# Patient Record
Sex: Female | Born: 1980 | Race: Black or African American | Hispanic: No | Marital: Single | Smoking: Never smoker
Health system: Southern US, Community
[De-identification: ages and names within clinical notes are randomized; demographics above are authoritative.]

## PROBLEM LIST (undated history)

## (undated) DIAGNOSIS — N1831 Chronic kidney disease, stage 3a: Secondary | ICD-10-CM

## (undated) DIAGNOSIS — J302 Other seasonal allergic rhinitis: Secondary | ICD-10-CM

## (undated) DIAGNOSIS — F419 Anxiety disorder, unspecified: Secondary | ICD-10-CM

## (undated) DIAGNOSIS — Z9289 Personal history of other medical treatment: Secondary | ICD-10-CM

## (undated) HISTORY — DX: Chronic kidney disease, stage 3a: N18.31

## (undated) HISTORY — PX: WISDOM TOOTH EXTRACTION: SHX21

## (undated) HISTORY — PX: TUBAL LIGATION: SHX77

## (undated) HISTORY — DX: Anxiety disorder, unspecified: F41.9

---
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
## 2001-06-23 ENCOUNTER — Emergency Department (HOSPITAL_COMMUNITY): Admission: EM | Admit: 2001-06-23 | Discharge: 2001-06-23 | Payer: Self-pay | Admitting: Emergency Medicine

## 2001-08-16 ENCOUNTER — Other Ambulatory Visit: Admission: RE | Admit: 2001-08-16 | Discharge: 2001-08-16 | Payer: Self-pay | Admitting: *Deleted

## 2001-08-28 ENCOUNTER — Inpatient Hospital Stay (HOSPITAL_COMMUNITY): Admission: AD | Admit: 2001-08-28 | Discharge: 2001-08-28 | Payer: Self-pay | Admitting: Obstetrics and Gynecology

## 2001-08-28 ENCOUNTER — Encounter: Payer: Self-pay | Admitting: Obstetrics & Gynecology

## 2002-02-07 ENCOUNTER — Inpatient Hospital Stay (HOSPITAL_COMMUNITY): Admission: AD | Admit: 2002-02-07 | Discharge: 2002-02-07 | Payer: Self-pay | Admitting: *Deleted

## 2002-02-08 ENCOUNTER — Inpatient Hospital Stay (HOSPITAL_COMMUNITY): Admission: AD | Admit: 2002-02-08 | Discharge: 2002-02-08 | Payer: Self-pay | Admitting: Obstetrics and Gynecology

## 2002-02-19 ENCOUNTER — Emergency Department (HOSPITAL_COMMUNITY): Admission: EM | Admit: 2002-02-19 | Discharge: 2002-02-19 | Payer: Self-pay | Admitting: Emergency Medicine

## 2002-03-31 ENCOUNTER — Inpatient Hospital Stay (HOSPITAL_COMMUNITY): Admission: AD | Admit: 2002-03-31 | Discharge: 2002-03-31 | Payer: Self-pay | Admitting: *Deleted

## 2002-09-02 ENCOUNTER — Inpatient Hospital Stay (HOSPITAL_COMMUNITY): Admission: AD | Admit: 2002-09-02 | Discharge: 2002-09-02 | Payer: Self-pay | Admitting: Obstetrics and Gynecology

## 2002-10-08 ENCOUNTER — Inpatient Hospital Stay (HOSPITAL_COMMUNITY): Admission: AD | Admit: 2002-10-08 | Discharge: 2002-10-08 | Payer: Self-pay | Admitting: *Deleted

## 2002-10-09 ENCOUNTER — Encounter: Payer: Self-pay | Admitting: *Deleted

## 2002-11-01 ENCOUNTER — Inpatient Hospital Stay (HOSPITAL_COMMUNITY): Admission: AD | Admit: 2002-11-01 | Discharge: 2002-11-01 | Payer: Self-pay | Admitting: Obstetrics and Gynecology

## 2002-11-20 ENCOUNTER — Inpatient Hospital Stay (HOSPITAL_COMMUNITY): Admission: AD | Admit: 2002-11-20 | Discharge: 2002-11-20 | Payer: Self-pay | Admitting: Obstetrics & Gynecology

## 2002-12-19 ENCOUNTER — Other Ambulatory Visit: Admission: RE | Admit: 2002-12-19 | Discharge: 2002-12-19 | Payer: Self-pay | Admitting: Obstetrics and Gynecology

## 2003-01-19 ENCOUNTER — Ambulatory Visit (HOSPITAL_COMMUNITY): Admission: RE | Admit: 2003-01-19 | Discharge: 2003-01-19 | Payer: Self-pay | Admitting: Obstetrics and Gynecology

## 2003-01-19 ENCOUNTER — Encounter: Payer: Self-pay | Admitting: Obstetrics and Gynecology

## 2003-03-28 ENCOUNTER — Inpatient Hospital Stay (HOSPITAL_COMMUNITY): Admission: AD | Admit: 2003-03-28 | Discharge: 2003-03-28 | Payer: Self-pay | Admitting: Obstetrics and Gynecology

## 2003-05-01 ENCOUNTER — Inpatient Hospital Stay (HOSPITAL_COMMUNITY): Admission: AD | Admit: 2003-05-01 | Discharge: 2003-05-01 | Payer: Self-pay | Admitting: Obstetrics and Gynecology

## 2003-05-12 ENCOUNTER — Observation Stay (HOSPITAL_COMMUNITY): Admission: AD | Admit: 2003-05-12 | Discharge: 2003-05-12 | Payer: Self-pay | Admitting: Obstetrics and Gynecology

## 2003-06-02 DIAGNOSIS — Z9289 Personal history of other medical treatment: Secondary | ICD-10-CM

## 2003-06-02 HISTORY — DX: Personal history of other medical treatment: Z92.89

## 2003-06-05 ENCOUNTER — Inpatient Hospital Stay (HOSPITAL_COMMUNITY): Admission: AD | Admit: 2003-06-05 | Discharge: 2003-06-05 | Payer: Self-pay | Admitting: Obstetrics and Gynecology

## 2003-06-15 ENCOUNTER — Inpatient Hospital Stay (HOSPITAL_COMMUNITY): Admission: AD | Admit: 2003-06-15 | Discharge: 2003-06-19 | Payer: Self-pay | Admitting: Obstetrics and Gynecology

## 2003-10-08 ENCOUNTER — Emergency Department (HOSPITAL_COMMUNITY): Admission: EM | Admit: 2003-10-08 | Discharge: 2003-10-08 | Payer: Self-pay | Admitting: Emergency Medicine

## 2003-11-11 ENCOUNTER — Inpatient Hospital Stay (HOSPITAL_COMMUNITY): Admission: AD | Admit: 2003-11-11 | Discharge: 2003-11-11 | Payer: Self-pay | Admitting: Obstetrics and Gynecology

## 2004-03-09 ENCOUNTER — Inpatient Hospital Stay (HOSPITAL_COMMUNITY): Admission: AD | Admit: 2004-03-09 | Discharge: 2004-03-09 | Payer: Self-pay | Admitting: Obstetrics and Gynecology

## 2004-08-01 ENCOUNTER — Emergency Department (HOSPITAL_COMMUNITY): Admission: EM | Admit: 2004-08-01 | Discharge: 2004-08-01 | Payer: Self-pay | Admitting: Emergency Medicine

## 2004-11-06 ENCOUNTER — Inpatient Hospital Stay (HOSPITAL_COMMUNITY): Admission: AD | Admit: 2004-11-06 | Discharge: 2004-11-06 | Payer: Self-pay | Admitting: Obstetrics and Gynecology

## 2005-04-23 ENCOUNTER — Inpatient Hospital Stay (HOSPITAL_COMMUNITY): Admission: AD | Admit: 2005-04-23 | Discharge: 2005-04-23 | Payer: Self-pay | Admitting: Obstetrics and Gynecology

## 2005-05-07 ENCOUNTER — Inpatient Hospital Stay (HOSPITAL_COMMUNITY): Admission: AD | Admit: 2005-05-07 | Discharge: 2005-05-07 | Payer: Self-pay | Admitting: Obstetrics and Gynecology

## 2005-06-10 ENCOUNTER — Inpatient Hospital Stay (HOSPITAL_COMMUNITY): Admission: AD | Admit: 2005-06-10 | Discharge: 2005-06-10 | Payer: Self-pay | Admitting: Family Medicine

## 2005-06-29 ENCOUNTER — Ambulatory Visit: Payer: Self-pay | Admitting: Sports Medicine

## 2005-06-30 ENCOUNTER — Inpatient Hospital Stay (HOSPITAL_COMMUNITY): Admission: AD | Admit: 2005-06-30 | Discharge: 2005-06-30 | Payer: Self-pay | Admitting: *Deleted

## 2005-07-06 ENCOUNTER — Inpatient Hospital Stay (HOSPITAL_COMMUNITY): Admission: AD | Admit: 2005-07-06 | Discharge: 2005-07-06 | Payer: Self-pay | Admitting: Obstetrics & Gynecology

## 2005-07-22 ENCOUNTER — Ambulatory Visit (HOSPITAL_COMMUNITY): Admission: RE | Admit: 2005-07-22 | Discharge: 2005-07-22 | Payer: Self-pay | Admitting: Obstetrics & Gynecology

## 2005-08-15 ENCOUNTER — Inpatient Hospital Stay (HOSPITAL_COMMUNITY): Admission: AD | Admit: 2005-08-15 | Discharge: 2005-08-15 | Payer: Self-pay | Admitting: Obstetrics & Gynecology

## 2005-10-14 ENCOUNTER — Ambulatory Visit (HOSPITAL_COMMUNITY): Admission: RE | Admit: 2005-10-14 | Discharge: 2005-10-14 | Payer: Self-pay | Admitting: Obstetrics

## 2005-11-13 ENCOUNTER — Ambulatory Visit (HOSPITAL_COMMUNITY): Admission: RE | Admit: 2005-11-13 | Discharge: 2005-11-13 | Payer: Self-pay | Admitting: Obstetrics

## 2005-12-11 ENCOUNTER — Inpatient Hospital Stay (HOSPITAL_COMMUNITY): Admission: AD | Admit: 2005-12-11 | Discharge: 2005-12-14 | Payer: Self-pay | Admitting: Obstetrics

## 2005-12-11 ENCOUNTER — Encounter (INDEPENDENT_AMBULATORY_CARE_PROVIDER_SITE_OTHER): Payer: Self-pay | Admitting: Specialist

## 2006-07-08 ENCOUNTER — Emergency Department (HOSPITAL_COMMUNITY): Admission: EM | Admit: 2006-07-08 | Discharge: 2006-07-08 | Payer: Self-pay | Admitting: Emergency Medicine

## 2006-07-29 DIAGNOSIS — J45909 Unspecified asthma, uncomplicated: Secondary | ICD-10-CM | POA: Insufficient documentation

## 2006-08-10 ENCOUNTER — Inpatient Hospital Stay (HOSPITAL_COMMUNITY): Admission: AD | Admit: 2006-08-10 | Discharge: 2006-08-10 | Payer: Self-pay | Admitting: Obstetrics & Gynecology

## 2007-04-24 ENCOUNTER — Inpatient Hospital Stay (HOSPITAL_COMMUNITY): Admission: AD | Admit: 2007-04-24 | Discharge: 2007-04-24 | Payer: Self-pay | Admitting: Obstetrics & Gynecology

## 2007-04-29 ENCOUNTER — Emergency Department (HOSPITAL_COMMUNITY): Admission: EM | Admit: 2007-04-29 | Discharge: 2007-04-29 | Payer: Self-pay | Admitting: Emergency Medicine

## 2007-07-01 ENCOUNTER — Ambulatory Visit (HOSPITAL_COMMUNITY): Admission: RE | Admit: 2007-07-01 | Discharge: 2007-07-01 | Payer: Self-pay | Admitting: Obstetrics & Gynecology

## 2007-08-11 ENCOUNTER — Ambulatory Visit (HOSPITAL_COMMUNITY): Admission: RE | Admit: 2007-08-11 | Discharge: 2007-08-11 | Payer: Self-pay | Admitting: Obstetrics & Gynecology

## 2007-08-18 ENCOUNTER — Inpatient Hospital Stay (HOSPITAL_COMMUNITY): Admission: AD | Admit: 2007-08-18 | Discharge: 2007-08-18 | Payer: Self-pay | Admitting: Obstetrics & Gynecology

## 2007-11-10 ENCOUNTER — Inpatient Hospital Stay (HOSPITAL_COMMUNITY): Admission: RE | Admit: 2007-11-10 | Discharge: 2007-11-13 | Payer: Self-pay | Admitting: Obstetrics & Gynecology

## 2007-11-10 ENCOUNTER — Encounter: Payer: Self-pay | Admitting: Obstetrics & Gynecology

## 2007-12-15 ENCOUNTER — Emergency Department (HOSPITAL_COMMUNITY): Admission: EM | Admit: 2007-12-15 | Discharge: 2007-12-15 | Payer: Self-pay | Admitting: Emergency Medicine
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
## 2008-11-21 ENCOUNTER — Ambulatory Visit (HOSPITAL_COMMUNITY): Admission: RE | Admit: 2008-11-21 | Discharge: 2008-11-21 | Payer: Self-pay | Admitting: Obstetrics

## 2009-03-25 ENCOUNTER — Inpatient Hospital Stay (HOSPITAL_COMMUNITY): Admission: AD | Admit: 2009-03-25 | Discharge: 2009-03-25 | Payer: Self-pay | Admitting: Obstetrics

## 2009-09-18 ENCOUNTER — Encounter: Admission: RE | Admit: 2009-09-18 | Discharge: 2009-09-18 | Payer: Self-pay | Admitting: Family Medicine

## 2009-10-23 ENCOUNTER — Inpatient Hospital Stay (HOSPITAL_COMMUNITY): Admission: AD | Admit: 2009-10-23 | Discharge: 2009-10-23 | Payer: Self-pay | Admitting: Obstetrics

## 2010-01-15 ENCOUNTER — Inpatient Hospital Stay (HOSPITAL_COMMUNITY): Admission: AD | Admit: 2010-01-15 | Discharge: 2010-01-15 | Payer: Self-pay | Admitting: Obstetrics & Gynecology

## 2010-01-15 ENCOUNTER — Ambulatory Visit: Payer: Self-pay | Admitting: Nurse Practitioner

## 2010-03-10 ENCOUNTER — Inpatient Hospital Stay (HOSPITAL_COMMUNITY): Admission: AD | Admit: 2010-03-10 | Discharge: 2010-03-10 | Payer: Self-pay | Admitting: Obstetrics & Gynecology

## 2010-03-14 ENCOUNTER — Inpatient Hospital Stay (HOSPITAL_COMMUNITY): Admission: AD | Admit: 2010-03-14 | Discharge: 2010-03-14 | Payer: Self-pay | Admitting: Obstetrics

## 2010-03-14 ENCOUNTER — Ambulatory Visit: Payer: Self-pay | Admitting: Obstetrics and Gynecology

## 2010-05-13 ENCOUNTER — Emergency Department (HOSPITAL_COMMUNITY)
Admission: EM | Admit: 2010-05-13 | Discharge: 2010-05-13 | Payer: Self-pay | Source: Home / Self Care | Admitting: Emergency Medicine

## 2010-05-15 ENCOUNTER — Inpatient Hospital Stay (HOSPITAL_COMMUNITY)
Admission: AD | Admit: 2010-05-15 | Discharge: 2010-05-15 | Payer: Self-pay | Source: Home / Self Care | Attending: Obstetrics | Admitting: Obstetrics

## 2010-06-22 ENCOUNTER — Encounter: Payer: Self-pay | Admitting: Endocrinology

## 2010-08-11 LAB — POCT PREGNANCY, URINE: Preg Test, Ur: NEGATIVE

## 2010-08-11 LAB — URINALYSIS, ROUTINE W REFLEX MICROSCOPIC
Bilirubin Urine: NEGATIVE
Bilirubin Urine: NEGATIVE
Hgb urine dipstick: NEGATIVE
Ketones, ur: NEGATIVE mg/dL
Nitrite: NEGATIVE
Nitrite: NEGATIVE
Protein, ur: NEGATIVE mg/dL
Urobilinogen, UA: 0.2 mg/dL (ref 0.0–1.0)
pH: 5.5 (ref 5.0–8.0)

## 2010-08-11 LAB — WET PREP, GENITAL
Clue Cells Wet Prep HPF POC: NONE SEEN
Trich, Wet Prep: NONE SEEN

## 2010-08-11 LAB — POCT I-STAT, CHEM 8
Calcium, Ion: 1.13 mmol/L (ref 1.12–1.32)
Glucose, Bld: 82 mg/dL (ref 70–99)
HCT: 39 % (ref 36.0–46.0)

## 2010-08-11 LAB — GC/CHLAMYDIA PROBE AMP, GENITAL: Chlamydia, DNA Probe: NEGATIVE

## 2010-08-13 LAB — GC/CHLAMYDIA PROBE AMP, GENITAL
Chlamydia, DNA Probe: NEGATIVE
GC Probe Amp, Genital: NEGATIVE

## 2010-08-13 LAB — URINALYSIS, ROUTINE W REFLEX MICROSCOPIC
Bilirubin Urine: NEGATIVE
Hgb urine dipstick: NEGATIVE
Ketones, ur: NEGATIVE mg/dL
Ketones, ur: NEGATIVE mg/dL
Nitrite: NEGATIVE
Nitrite: NEGATIVE
Protein, ur: NEGATIVE mg/dL
Protein, ur: NEGATIVE mg/dL
Urobilinogen, UA: 0.2 mg/dL (ref 0.0–1.0)
Urobilinogen, UA: 0.2 mg/dL (ref 0.0–1.0)

## 2010-08-13 LAB — CBC
HCT: 37.1 % (ref 36.0–46.0)
MCH: 30.7 pg (ref 26.0–34.0)
MCHC: 34.2 g/dL (ref 30.0–36.0)
Platelets: 196 10*3/uL (ref 150–400)
RBC: 4.13 MIL/uL (ref 3.87–5.11)

## 2010-08-13 LAB — POCT PREGNANCY, URINE: Preg Test, Ur: NEGATIVE

## 2010-08-13 LAB — WET PREP, GENITAL: Trich, Wet Prep: NONE SEEN

## 2010-08-14 LAB — URINALYSIS, ROUTINE W REFLEX MICROSCOPIC
Glucose, UA: NEGATIVE mg/dL
Protein, ur: NEGATIVE mg/dL
Specific Gravity, Urine: >1.03 — ABNORMAL HIGH (ref 1.005–1.030)

## 2010-08-14 LAB — GC/CHLAMYDIA PROBE AMP, GENITAL
Chlamydia, DNA Probe: NEGATIVE
GC Probe Amp, Genital: NEGATIVE

## 2010-08-14 LAB — CBC
Hemoglobin: 12.1 g/dL (ref 12.0–15.0)
MCHC: 33.3 g/dL (ref 30.0–36.0)
RDW: 14.8 % (ref 11.5–15.5)
WBC: 5.7 10*3/uL (ref 4.0–10.5)

## 2010-08-14 LAB — POCT PREGNANCY, URINE: Preg Test, Ur: NEGATIVE

## 2010-08-14 LAB — WET PREP, GENITAL: Clue Cells Wet Prep HPF POC: NONE SEEN

## 2010-08-18 LAB — URINE CULTURE
Colony Count: NO GROWTH
Culture: NO GROWTH

## 2010-08-18 LAB — GC/CHLAMYDIA PROBE AMP, GENITAL
Chlamydia, DNA Probe: NEGATIVE
GC Probe Amp, Genital: NEGATIVE

## 2010-08-18 LAB — URINALYSIS, ROUTINE W REFLEX MICROSCOPIC
Bilirubin Urine: NEGATIVE
Hgb urine dipstick: NEGATIVE
Specific Gravity, Urine: 1.025 (ref 1.005–1.030)
pH: 6 (ref 5.0–8.0)

## 2010-08-18 LAB — URINE MICROSCOPIC-ADD ON

## 2010-09-04 LAB — URINALYSIS, ROUTINE W REFLEX MICROSCOPIC
Hgb urine dipstick: NEGATIVE
Protein, ur: NEGATIVE mg/dL
Urobilinogen, UA: 0.2 mg/dL (ref 0.0–1.0)

## 2010-09-04 LAB — BASIC METABOLIC PANEL
CO2: 26 mEq/L (ref 19–32)
Calcium: 9 mg/dL (ref 8.4–10.5)
GFR calc Af Amer: >60 mL/min (ref >60)
Sodium: 136 mEq/L (ref 135–145)

## 2010-09-04 LAB — CBC
Hemoglobin: 12.7 g/dL (ref 12.0–15.0)
MCHC: 33.4 g/dL (ref 30.0–36.0)
RBC: 4.27 MIL/uL (ref 3.87–5.11)

## 2010-09-04 LAB — POCT PREGNANCY, URINE: Preg Test, Ur: NEGATIVE

## 2010-09-04 LAB — WET PREP, GENITAL: Trich, Wet Prep: NONE SEEN

## 2010-10-14 NOTE — Op Note (Signed)
[name] [name], [name] [name]            ACCOUNT NO.:  [id number]   MEDICAL RECORD NO.:  [id number]          PATIENT TYPE:  INP   LOCATION:  9107                          FACILITY:  [facility]   PHYSICIAN:  Roseanna Rainbow, M.D.DATE OF BIRTH:  1980/08/02   DATE OF PROCEDURE:  11/10/2007  DATE OF DISCHARGE:                               OPERATIVE REPORT   PREOPERATIVE DIAGNOSIS:  Intrauterine pregnancy at 35 plus weeks with a  history of 2 previous cesarean deliveries, desires a sterilization  procedure.   POSTOPERATIVE DIAGNOSIS:  Intrauterine pregnancy at 38 plus weeks with a  history of 2 previous cesarean deliveries, desires a sterilization  procedure.   PROCEDURE:  Repeat cesarean delivery and bilateral tubal ligation.   SURGEON:  Roseanna Rainbow, M.D.   ANESTHESIA:  Spinal.   ESTIMATED BLOOD LOSS:  600 mL.   INTRAVENOUS FLUIDS:  As per anesthesiology.   URINE OUTPUT:  As per anesthesiology.   COMPLICATIONS:  None.   PROCEDURE IN DETAIL:  The patient was taken to the operating room with  an IV running.  A spinal anesthetic was then placed.  She was then  placed in the dorsal lithotomy position with a leftward tilt and prepped  and draped in the usual sterile fashion.  After a time-out had been  completed, a Pfannenstiel skin incision was made with a scalpel through  the previous scar and carried down to the underlying fascia.  The fascia  was nicked in the midline.  The fascial incision was then extended  bilaterally with curved Mayo scissors.  The superior aspect of the  fascial incision was tented up and the underlying rectus muscles  dissected off.  The inferior aspect of the fascial incision was  manipulated in similar fashion.  The rectus muscles were separated in  the midline.  The parietal peritoneum was tented up and entered sharply.  This incision was then extended superiorly and inferiorly with good  visualization of the bladder.  The bladder blade was then  placed.  The  vesicouterine peritoneum was tented up and entered sharply.  This  incision was then extended bilaterally and the bladder flap created  sharply.  The bladder blade was then replaced.  The lower uterine  segment was then incised in a transverse fashion with a scalpel.  This  incision was then extended bluntly.  The infant's head was then  delivered atraumatically.  The oropharynx was suctioned with bulb  suction.  The cord was clamped and cut.  The infant was handed off to  the awaiting neonatologist.  The Apgars were 9 at one and five minutes  respectively.  The placenta was then removed.  The intrauterine cavity  was evacuated of any remaining amniotic fluid, clots, and debris with  moist laparotomy sponge.  The uterine incision was then reapproximated  in a running interlocking fashion using a suture of 0 Monocryl.  Adequate hemostasis was noted.  The left fallopian tube was then grasped  with a Babcock clamp and followed up to the fimbriated end.  The mid-  isthmic portion of the tube was regrasped.  A window was  made in the  underlying mesosalpinx.  A 2-cm segment of tube was tied off and  excised.  The right fallopian tube was then grasped with a Babcock clamp  and followed out to the fimbriated end.  The mid-isthmic portion of the  tube was then regrasped.  A 2-cm segment of tube was doubly ligated and  excised.  Again, adequate hemostasis was noted.  The paracolic gutters  were then irrigated.  The parietal peritoneum was then reapproximated in  a running fashion using 2-0 Vicryl.  The fascia was closed in a running  fashion using 0 PDS.  The skin was closed in a subcuticular fashion  using 3-0 Monocryl.  At the close of the procedure, the instrument and  pack counts were said to be correct x2.  The patient was taken to the  PACU, awake and in stable condition.      Roseanna Rainbow, M.D.  Electronically Signed     LAJ/MEDQ  D:  11/10/2007  T:  11/11/2007   Job:  [id number]

## 2010-10-14 NOTE — H&P (Signed)
NAMEKIMBERLA, DRISKILL            ACCOUNT NO.:  [id number]   MEDICAL RECORD NO.:  [id number]          PATIENT TYPE:  INP   LOCATION:  NA                            [facility]:  [facility]   PHYSICIAN:  Roseanna Rainbow, M.D.DATE OF BIRTH:  September 16, 1980   DATE OF ADMISSION:  DATE OF DISCHARGE:                              HISTORY & PHYSICAL   CHIEF COMPLAINT:  The patient is a 30 year old para 2 with an estimated  date of confinement of June 20 with an intrauterine pregnancy at 30-5/7  weeks with a history of 2 previous cesarean deliveries for an elective  repeat cesarean delivery and bilateral tubal ligation.   HISTORY OF PRESENT ILLNESS:  Please see the above.   ALLERGIES:  No known drug allergies.   MEDICATIONS:  Please see the medication reconciliation form.   OB RISK FACTORS:  Please see the above.   PAST OBSTETRICAL HISTORY:  There is a history of a voluntary termination  of pregnancy.  In January 2005, she was delivered of a live born female 8  pounds 4 ounces at 41 weeks via a cesarean delivery.  In July 2007, she  was delivered of a live born female 7 pounds 1 ounce at 39 weeks via a  repeat cesarean delivery.   PRENATAL LABS:  Blood type is A+, antibody screen negative, Chlamydia  probe negative, urine culture and sensitivity strep viridans in February  2009.  Pelvic ultrasound performed on January 30, at 19 weeks 3 days, no  placenta previa, normal AFI, normal anatomy, and size was consistent  with dates.  Pap smear negative.  One-hour GTT 88, hepatitis B surface  antigen negative, hematocrit 30.8, hemoglobin 10, and HIV nonreactive.  Quad screen normal.  Platelet count 187,000, RPR nonreactive, rubella  immune, sickle cell negative, and varicella immune.   PAST GYN HISTORY:  Noncontributory.   PAST MEDICAL HISTORY:  Asthma.   PAST SURGICAL HISTORY:  Please see the above.   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  She reports stress at  work.  Denies illicit drug use.   FAMILY HISTORY:  Hypertension.   PHYSICAL EXAM:  VITAL SIGNS:  Blood pressure 120/70.  Fetal heart rate  by Doppler 140s.  LUNGS:  Clear to auscultation bilaterally.  HEART:  Regular rate and rhythm.  ABDOMEN:  Gravid.  PELVIC EXAM:  Deferred.  EXTREMITIES:  Trace to 1+ pretibial edema.   ASSESSMENT:  Intrauterine pregnancy at 38 plus weeks with a history of 2  previous cesarean deliveries, desires a sterilization procedure.   PLAN:  Admission, repeat cesarean delivery and bilateral tubal ligation.      Roseanna Rainbow, M.D.  Electronically Signed     LAJ/MEDQ  D:  11/09/2007  T:  11/10/2007  Job:  [id number]

## 2010-10-17 NOTE — Discharge Summary (Signed)
NAME:  [name] [name], [name] [name]                      ACCOUNT NO.:  [id number]   MEDICAL RECORD NO.:  [id number]                   PATIENT TYPE:  INP   LOCATION:  9174                                 [facility]ACILITY:  [facility]   PHYSICIAN:  Osborn Coho, M.D.                DATE OF BIRTH:  June 04, 1980   DATE OF ADMISSION:  05/11/2003  DATE OF DISCHARGE:                                 DISCHARGE SUMMARY   ADMITTING DIAGNOSES:  1. Intrauterine pregnancy at 35 and five-sevenths weeks.  2. Status post maternal fall.   DISCHARGE DIAGNOSES:  1. Intrauterine pregnancy at 35 and five-sevenths weeks.  2. Status post maternal fall.   HOSPITAL COURSE:  Ms. [name] [name] is a 30 year old gravida 2 para 0-0-1-0 who  was admitted at 11 and five-sevenths weeks after falling on the wet ground  and striking her abdomen.  She reported positive fetal movement as well as  mild cramping since that time.  She denied leaking and bleeding or any other  physical trauma.   Her pregnancy had been followed by the [facility] M.D. service  and had been remarkable for:  1. ASPIRIN allergy.  2. History of chlamydia in 2003.  3. Family history of polydactyly.  4. Questionable dates.  5. Group B strep negative.   She was admitted on May 11, 2003 and was placed on fetal monitor.  Her  monitor strips remained reactive and reassuring with no decelerations.  Initially she had uterine contractions every one to two minutes lasting 40  seconds which were mild to palpation.  Her abdomen remained soft between  uterine contractions.  Her cervix was closed and thick.  Pregnancy  ultrasonography showed no evidence of placental abruption.  Her amniotic  fluid was at normal level.  Cervix was 3.9 cm on ultrasound.  An estimated  fetal weight was between the 75th and 90th percentile.  She remained  hospitalized overnight.  By the morning of May 12, 2003 she was feeling  well and was requesting to go home.  She denied  cramping, pain, or bleeding.  She reported positive fetal movement.  Her cervix remained unchanged,  closed, and thick, and she was deemed to have received the full benefit of  her hospital stay.   DISCHARGE INSTRUCTIONS:  Labor precautions as well as fetal kick count were  reviewed with the patient.   DISCHARGE MEDICATIONS:  Prenatal vitamin one p.o. daily.   DISCHARGE FOLLOW-UP:  Will occur at [facility] on May 17, 2003 as scheduled or p.r.n.     Cam Hai, C.N.M.                     Osborn Coho, M.D.   KS/MEDQ  D:  05/12/2003  T:  05/12/2003  Job:  [id number]

## 2010-10-17 NOTE — Op Note (Signed)
NAME:  [name] [name], [name] [name]                      ACCOUNT NO.:  [id number]   MEDICAL RECORD NO.:  [id number]                   PATIENT TYPE:  INP   LOCATION:  9374                                 [facility]ACILITY:  [facility]   PHYSICIAN:  Osborn Coho, M.D.                DATE OF BIRTH:  03/28/1981   DATE OF PROCEDURE:  06/16/2003  DATE OF DISCHARGE:                                 OPERATIVE REPORT   PREOPERATIVE DIAGNOSES:  1. Term intrauterine pregnancy.  2. Preeclampsia.  3. Augmentation of labor.  4. Failure of second stage.   POSTOPERATIVE DIAGNOSES:  1. Term intrauterine pregnancy.  2. Preeclampsia.  3. Augmentation of labor.  4. Failure of second stage.  5. Direct occiput posterior presentation.   PROCEDURE:  Primary low transverse C-section via Pfannenstiel skin incision.   ANESTHESIA:  Epidural.   ATTENDING:  Osborn Coho, M.D.   ASSISTANT:  Concha Pyo. [place], C.N.M. until delivery of baby, then scrub  tech. assisted, Britt Bottom.   FLUIDS:  1400 mL.   ESTIMATED BLOOD LOSS:  1500 mL.   URINE OUTPUT:  175 mL.   FINDINGS:  Live female infant with Apgars of 8 at 1 minute and 9 at 5 minutes  zyvier, normal bilateral ovaries and tubes.  Cord gas:  Arterial 7.18,  venous 7.23, weight 8 pounds, 3 ounces.   COMPLICATIONS:  None.   DESCRIPTION OF PROCEDURE:  The patient was taken to the operating room after  the risks, benefits, and alternatives were discussed with the patient and  patient verbalized understanding and consent signed and witnessed.  The  patient was prepped and draped in the normal sterile fashion after an  epidural, a surgical level obtained through the epidural.  A Pfannenstiel  skin incision was made and carried down to the underlying layer of fascia  which was then excised bilaterally in the midline.  The fascia was extended  bilaterally with the Mayo scissors.  Kocher clamps were placed on the  superior aspect of the fascial incision and the rectus  muscle excised from  the fascia.  The same was done on the inferior aspect of the fascial  incision.  The muscle was separated in the midline and the peritoneum  entered bluntly.  Bladder blade placed for retraction and bladder flap  created with the Metzenbaum scissors.  The uterine incision was made and  infant's lip noted to protrude through incision.  The incision was extended  manually and once away from the face, extended bilaterally with the bandage  scissors.  The infant was in a direct occiput posterior presentation, and  head was delivered with significant effort, as it was deep in the pelvis.  The remaining part of the infant was delivered, and the cord was clamped and  cut, and the infant was handed to the awaiting pediatricians.  Cord gases  were sent.  The placenta was removed via fundal massage.  There was  uterine  atony noted, and an additional 20 units of Pitocin was added to the IV  fluids for a total of 40 units in a liter.  There was noted to be a small  extension on the left aspect of the uterine incision which was repaired with  0 Vicryl in a running locked fashion.  The uterine incision was repaired  after clearing all the anterior aspect of the uterus of all clots and  debris.  The uterine incision was repaired with 0 Vicryl in a running lock  fashion, and a second imbricating layer was performed.  The intra-abdominal  cavity was copiously irrigated, and bilateral ovaries and fallopian tubes  appeared normal.  The peritoneum was repaired with 3-0 chromic in a running  fashion after the uterine incision was noted to be hemostatic.  The fascia  was repaired with 0 Vicryl in a running fashion.  The subcutaneous tissue  was irrigated and made hemostatic with the Bovie.  The skin was closed with  staples.  Sponge, lap, and needle count was correct.  The patient tolerated  the procedure well and was returned to the recovery room in stable  condition.  While in the  operating room at the end of the procedure, the  patient's blood pressure was noted to be a little low with systolic in the  47W, and Hespan was given secondary to significant blood loss.  In addition,  3-0 chromic was used to repair a small abrasion on the vagina where the  vacuum was placed.                                               Osborn Coho, M.D.    AR/MEDQ  D:  06/16/2003  T:  06/16/2003  Job:  [id number]

## 2010-10-17 NOTE — Discharge Summary (Signed)
[name] [name], [name] [name]            ACCOUNT NO.:  [id number]   MEDICAL RECORD NO.:  [id number]          PATIENT TYPE:  INP   LOCATION:  9110                          [facility]ILITY:  [facility]   PHYSICIAN:  Charles A. Clearance Coots, M.D.DATE OF BIRTH:  11-29-1980   DATE OF ADMISSION:  12/11/2005  DATE OF DISCHARGE:  12/14/2005                                 DISCHARGE SUMMARY   ADMITTING DIAGNOSIS:  Term pregnancy, previous cesarean section/desires  repeat cesarean section.   DISCHARGE DIAGNOSIS:  1. Term pregnancy, previous cesarean section/desires repeat cesarean      section.  2. Transverse lie status post primary low transverse cesarean section on      December 11, 2005, viable female was delivered at 0817 hours with Apgars of      9 at one minute and 9 at five minutes, weight of 3215 grams, length of      51 cm.  Mother and infant discharged home in good condition.   REASON FOR ADMISSION:  This is a 30 year old black female G3, P1, estimated  date of confinement December 19, 2005 presents for repeat cesarean section,  previous cesarean section for failure to progress.   PAST MEDICAL HISTORY:  Surgery:  Cesarean section.  Illnesses:  Asthma.   MEDICATIONS:  Prenatal vitamins.   ALLERGIES:  NO KNOWN DRUG ALLERGIES   SOCIAL HISTORY:  Single.  Negative tobacco, alcohol or recreational drug  use.   FAMILY HISTORY:  Noncontributory.   REVIEW OF SYSTEMS:  Negative.   PHYSICAL EXAMINATION:  GENERAL:  Well-nourished, well-developed female in no  acute distress.  VITAL SIGNS:  Temperature 97.7, pulse 77, blood pressure 104/67.  CHEST:  Lungs were clear to auscultation bilaterally.  Heart regular rate  and rhythm.  ABDOMEN:  Gravid, nontender.  Cervix is long and closed, presenting part  could not be palpated and informal ultrasound revealed a transverse lie.   ADMITTING LABORATORY VALUES:  Hemoglobin 9.4, hematocrit 27.5, white blood  cell count 6400, platelets 229,000.   HOSPITAL COURSE:  The  patient underwent a repeat low transverse cesarean  section on December 11, 2005.  There are no intraoperative complications.  Postoperative course was complicated by anemia.  The patient was clinically  stable and had dropped only from a hemoglobin of 9.4 to a hemoglobin of 6.8.  She required no transfusion of blood products and was discharged home postop  day #3 in good condition.   DISCHARGE LABORATORY VALUES:  Hemoglobin 6.8, hematocrit 20, white blood  cell count 6600, platelets 182,000.   DISCHARGE DISPOSITION:  Medications:  Prenatal vitamins and ibuprofen were  prescribed along with Tylox for pain.  Routine written instructions were  given for discharge after cesarean section.  The patient is to call the  office for a followup appointment in 2 weeks.      Charles A. Clearance Coots, M.D.  Electronically Signed     CAH/MEDQ  D:  01/20/2006  T:  01/20/2006  Job:  [id number]

## 2010-10-17 NOTE — Op Note (Signed)
[name] [name], [name] [name]            ACCOUNT NO.:  [id number]   MEDICAL RECORD NO.:  [id number]          PATIENT TYPE:  INP   LOCATION:  9199                          [facility]   PHYSICIAN:  Charles A. Clearance Coots, M.D.DATE OF BIRTH:  [birth year]   DATE OF PROCEDURE:  12/11/2005  DATE OF DISCHARGE:                                 OPERATIVE REPORT   PREOPERATIVE DIAGNOSIS:  Term pregnancy, previous cesarean section, desires  repeat cesarean section.   POSTOPERATIVE DIAGNOSIS:  Term pregnancy, previous cesarean section, desires  repeat cesarean section, transverse lie.   PROCEDURE:  Repeat low transverse cesarean section.   SURGEON:  Coral Ceo, M.D.   ASSISTANT:  Ronnie Derby, R.N.   ANESTHESIA:  Spinal.   ESTIMATED BLOOD LOSS:  600 mL.   IV FLUIDS:  3300 mL.   URINE OUTPUT:  150 mL.   COMPLICATIONS:  None.  Foley to gravity.   FINDINGS:  Viable female at 72.  Apgars of 9 at 1 minute, 9 at five  minutes; weight of 7 pounds 1 ounce.   OPERATION:  The patient was brought to the operating room, and after  satisfactory spinal anesthesia, the abdomen was prepped and draped in the  usual sterile fashion.  A Pfannenstiel skin incision was made with a scalpel  through the previous scar down to the fascia.  Fascia was nicked in the  midline, and the fascial incision was extended to left to right with curved  Mayo scissors.  The superior and inferior fascial edges were taken off the  rectus muscles with both blunt sharp dissection.  The anterior fascia was  lacerated while sharply separating the fascia from the rectus muscle, and  this was saved for repair at conclusion of the procedure.  Rectus muscles  were then bluntly and sharply divided in the midline.  Peritoneum was  entered digitally and was digitally extended to left and to right.  The  bladder blade was positioned, and the vesicouterine fold of peritoneum above  the reflection of the urinary bladder was grasped with  forceps and was  incised and undermined with Metzenbaum scissors.  The incision was then  extended to left and to right with Metzenbaum scissors.  The bladder flap  was then bluntly developed, and bladder blade was repositioned in front of  the urinary bladder, placing it well out of operative field.  The uterus was  then entered transversely in the lower uterine segment with a scalpel.  The  uterine incision was extended to the left to the right with the bandage  scissors.  The presentation was then assessed and was noted to be transverse  lie with the head on the maternal right.  Breech was identified and was  brought up into the incision, and the delivery was accomplished as a frank  breech extraction without complications.  Umbilical cord was doubly clamped  and cut, and the infant's mouth and nose were suctioned with a suction bulb,  and the infant was handed off to the nursery staff.  Cord blood was  obtained, and the placenta was spontaneously expelled from the uterine  cavity  intact.  The endometrial surface was thoroughly debrided with a dry  lap sponge.  The edges of the uterine incision were grasped with ring  forceps.  Uterus was closed with a continuous interlocking suture of 3-0  Monocryl from each corner to the center.  Hemostasis was excellent.  The  pelvic cavity was then thoroughly irrigated with warm saline solution, and  all clots were removed.  The abdomen was then closed as follows:  Peritoneum  was closed with a continuous suture of 2-0 Monocryl.  The area of the  anterior fascia that was lacerated during the separation of the fascia from  the rectus muscle was closed with continuous suture of 0 Vicryl.  The fascia  was then closed with continuous suture of 0 PDS from each corner to the  center.  Subcutaneous tissue was thoroughly irrigated with warm saline  solution, and all areas of subcutaneous bleeding were coagulated with the  Bovie.  The skin was then closed  with surgical stainless steel staples.  Sterile bandage was applied to the incision closure.  Surgical technician  indicated that all needle, sponge and instrument counts were correct x2.  The patient tolerated the procedure well and was transported to recovery  room in satisfactory condition.      Charles A. Clearance Coots, M.D.  Electronically Signed     CAH/MEDQ  D:  12/11/2005  T:  12/11/2005  Job:  [id number]

## 2010-10-17 NOTE — H&P (Signed)
NAME:  CARLEN, REBUCK                      ACCOUNT NO.:  [id number]   MEDICAL RECORD NO.:  [id number]                   PATIENT TYPE:  MAT   LOCATION:  [facility]C                                 FACILITY:  [facility]   PHYSICIAN:  Osborn Coho, M.D.                DATE OF BIRTH:  February 06, 1981   DATE OF ADMISSION:  05/11/2003  DATE OF DISCHARGE:                                HISTORY & PHYSICAL   HISTORY OF PRESENT ILLNESS:  This is a 30 year old gravida 2 para 0-0-1-0 at  62 and five-sevenths weeks who presented status post falling on wet ground  and striking abdomen this morning.  Reports positive fetal movement but does  report mild cramping and denies bleeding.  She denies any other physical  trauma and denies physical assault.  Pregnancy has been followed by Dr.  Estanislado Pandy and remarkable for:  1. ASPIRIN allergy.  2. History of chlamydia.  3. Family history polydactyly.  4. Unsure dates.  5. Evaluation for PIH at 34 weeks which was negative.   OBSTETRICAL HISTORY:  Remarkable for an elective abortion in 2003 at eight  weeks with no complications.   MEDICAL HISTORY:  Remarkable for history of chlamydia in 2003 which was  treated, childhood varicella, and history of hepatitis B vaccination.  She  has a history of asthma diagnosed as an infant for which she uses nebulizers  as needed.   FAMILY HISTORY:  Remarkable for grandmother with heart attack.  Mother's  sister, grandfather, and grandmother with hypertension.  Grandmother with  diabetes.  Mother with diabetes.  An aunt with lymphoma.  Mother with  bipolar disorder.   GENETIC HISTORY:  Remarkable for the patient's sister who was born with an  extra digit and the father-of-the-baby's niece who had tongue attached to  the frenulum.   SOCIAL HISTORY:  The patient is single but involved with Sharlet Salina who  is involved and supportive.  She does not report a religious affiliation.  She denies any alcohol, tobacco, or drug use.   PRENATAL LABORATORY DATA:  Hemoglobin 10.7, platelets 225.  Blood type A  positive, antibody screen negative.  Sickle cell negative.  RPR nonreactive.  Rubella immune.  Hepatitis negative.  HIV nonreactive.  Gonorrhea negative,  chlamydia negative.  Cystic fibrosis negative.  Quad screen abnormal with  normal ultrasound and group B strep negative.   OBJECTIVE DATA:  VITAL SIGNS:  Stable, afebrile.  HEENT:  Within normal limits.  Thyroid not enlarged.  CHEST:  Clear to auscultation.  HEART:  Regular rate and rhythm.  ABDOMEN:  Soft between contractions and nontender.  EFM shows a reactive  fetal heart rate with no decelerations and uterine contractions every one to  two minutes which are mild.  PELVIC:  Cervix was closed, long, and -2.  BACK:  Negative CVA tenderness.  EXTREMITIES:  Within normal limits.   Ultrasound shows fetal heart rate of 145, positive fetal movement, cephalic  presentation.  Placenta is anterior, grade 1, no evidence of abruption.  AFI  is 12.3.  Growth is 75-95 percentile.  Cervix is 3.9 cm long.   ASSESSMENT:  1. Intrauterine pregnancy at 35 and five-sevenths weeks.  2. Status post maternal fall.  3. Uterine contractions.   PLAN:  1. Admit to antenatal per Dr. Su Hilt.  2. Routine M.D. orders.  3. Continuous fetal monitoring.  4. Routine antenatal orders.     Marie L. Lynam, C.N.M.                 Osborn Coho, M.D.    MLW/MEDQ  D:  05/11/2003  T:  05/11/2003  Job:  [id number]

## 2010-10-17 NOTE — Discharge Summary (Signed)
[name] [name], [name] [name]            ACCOUNT NO.:  [id number]   MEDICAL RECORD NO.:  [id number]          PATIENT TYPE:  INP   LOCATION:  9107                          [facility]:  [facility]   PHYSICIAN:  Charles A. Clearance Coots, M.D.DATE OF BIRTH:  [birth year]   DATE OF ADMISSION:  11/10/2007  DATE OF DISCHARGE:  11/13/2007                               DISCHARGE SUMMARY   ADMITTING DIAGNOSES:  [redacted] weeks gestation, 2 previous cesarean sections,  desire repeat cesarean section, desire permanent sterilization.   DISCHARGE DIAGNOSES:  [redacted] weeks gestation, 2 previous cesarean sections,  desire repeat cesarean section, desire permanent sterilization, status  post repeat low-transverse cesarean section on November 10, 2007, viable  female delivered at 1323, Apgars of 9 at 1 minute and 9 at 5 minutes,  weight of 2715 g, length of 49.53 cm.  Mother and infant discharged home  in good condition.   REASON FOR ADMISSION:  A 30 year old para 2 with estimated date of  confinement of November 19, 2007, history of 2 previous cesarean sections,  desired repeat cesarean section, desired permanent sterilization.   PAST MEDICAL HISTORY:  Surgery, cesarean section x2.  Illnesses, asthma.   MEDICATIONS:  Prenatal vitamins.   ALLERGIES:  No known drug allergies.   SOCIAL HISTORY:  Engaged.  Works as a Environmental manager.  Negative tobacco,  alcohol, or recreational drug use.   FAMILY HISTORY:  Positive for hypertension.   PHYSICAL EXAM:  GENERAL:  Well-nourished, well-developed female in no  acute distress.  She is afebrile.  VITAL SIGNS:  Blood pressure 120/70, fetal heart rate 140-150 beats per  minute with Doppler.  LUNGS:  Clear to auscultation bilaterally.  HEART:  Regular rate and rhythm.  ABDOMEN:  Gravid, nontender.  PELVIC:  Deferred.  EXTREMITIES:  Trace to 1+ pretibial edema.   ADMITTING LABS:  Hemoglobin 10.2, hematocrit 29.7, white blood cell  count 5400, platelets 183,000.  RPR was nonreactive.   HOSPITAL  COURSE:  The patient underwent a repeat low transverse cesarean  section and bilateral tubal ligation on November 10, 2007.  There were no  intraoperative complications.  Postoperative course was uncomplicated.  The patient was discharged home on postop day #3 in good condition.   DISCHARGE LABS:  Hemoglobin 9.7, hematocrit 27.7, white blood cell count  8800, platelets 181,000.   DISCHARGE DISPOSITION:  Medications, continue prenatal vitamins.  Ibuprofen and Percocet was prescribed for pain.  Routine written  instructions were given for discharge after cesarean section.  The  patient is to call our office for a followup appointment in 2 weeks.      Charles A. Clearance Coots, M.D.  Electronically Signed     CAH/MEDQ  D:  01/06/2008  T:  01/07/2008  Job:  [id number]

## 2010-10-17 NOTE — Discharge Summary (Signed)
NAME:  [name] [name], [name] [name]                      ACCOUNT NO.:  [id number]   MEDICAL RECORD NO.:  [id number]                   PATIENT TYPE:  INP   LOCATION:  9311                                 [facility]   PHYSICIAN:  Hal Morales, M.D.             DATE OF BIRTH:  Mar 21, 1981   DATE OF ADMISSION:  06/15/2003  DATE OF DISCHARGE:  06/19/2003                                 DISCHARGE SUMMARY   ADMISSION DIAGNOSES:  1. Intrauterine pregnancy at term.  2. Preeclampsia.  3. Early labor with spontaneous rupture of membranes.  4. Group B Strep negative.   DISCHARGE DIAGNOSES:  1. Intrauterine pregnancy at term.  2. Preeclampsia.  3. Early labor with spontaneous rupture of membranes.  4. Group B Strep negative.  5. Failure of second stage and direct occiput posterior presentation.   PROCEDURES:  1. Epidural anesthesia.  2. Augmentation of labor.  3. Primary low transverse cesarean section.  4. Magnesium sulfate administration.  5. Adult ICU care.   HOSPITAL COURSE:  The patient was admitted with spontaneous rupture of  membranes in early labor.  Her cervix on admission was 1 cm dilated and she  was placed on the monitor and PIH labs were drawn secondary to elevated  blood pressures of 128 to 150/90 to 100.  Catheterized UA was obtained.  Her  urine returned with 30 mg per liter of protein and diagnosis was therefore  made of preeclampsia.  Pitocin was begun by high dose protocol to augment  labor.  Epidural was placed for analgesia.  She progressed throughout the  afternoon to complete dilatation. She pushed to a vertex position of +2 to  +3.  Vacuum assisted delivery was attempted without success.  Risks and  benefits were reviewed with the patient who elected to proceed with elective  primary low transverse cesarean section.  During labor, her baby remained  stable.  Low transverse cesarean section was performed by Dr. Su Hilt under  epidural anesthesia with an EBL of 1500  mL due to an extension of her  uterine incision.  A live female infant named Lisbeth Renshaw was delivered.  Apgars 8  and 9 weighing 8 pounds 3 ounces in a direct occiput posterior position.  He  was taken to the nursery and the patient was taken to recovery.  After  recovery, the patient was taken to the AICU for monitoring and magnesium  sulfate therapy.  She did well except for a severe anemia related to her  blood loss in the operating room.  This anemia was treated on postoperative  day #1 with two units packed red blood cell infusion for relief of  symptomatic anemia.  Her hemoglobin prior to transfusion was 5.5.  Pressures  ranged 110 to 140/50 to 70 with orthostatic changes noted upon standing.  Oxygen saturation was 92 to 96% and urine output began to increase.   On postoperative day #2, she was doing well and  was without dizziness.  Blood pressures were 130 to 140/70 to 84.  Oxygen saturations within normal  limits.  Urine output was adequate.  Hemoglobin went up to 7.5.  Creatinine  was 1.1.  One dose of Lasix was given to assist in diuresis.  This produced  1000 mL of diuresis over one hour's time.  The patient was transferred to  the mother/baby unit on the third floor where she did well.   On postoperative day #3, she was doing well with mild incisional pain which  was controlled with medications.  Blood pressure was 121 to 136/72 to 88.  Urine output was sufficient.  Heart was regular rate and rhythm.  Lungs were  clear bilaterally.  Abdomen was soft and appropriately tender with positive  bowel sounds.  Incision was clean, dry and intact.  Staples were removed and  Steri-Strips placed.  Lochia was minimum and the patient was deemed to have  received full benefit of her hospital stay and was discharged home.   DISCHARGE MEDICATIONS:  1. Tylox one to two p.o. q.4h. p.r.n. pain.  2. Motrin 600 mg p.o. q.6h. p.r.n. pain.  3. Micronor one p.o. daily.   DISCHARGE LABORATORY DATA:   White blood cell count 8.6, hemoglobin 7.5,  hematocrit 21.9, platelets 184.   DISCHARGE INSTRUCTIONS:  Per [facility] handout.   FOLLOW UP:  This will include blood pressure check at home in the next one  to two days by her [facility] nurse and then in one week at the office for  blood pressure check and then in six weeks.     Marie L. Mcnaught, C.N.M.                 Hal Morales, M.D.    MLW/MEDQ  D:  06/19/2003  T:  06/20/2003  Job:  [id number]

## 2010-10-17 NOTE — H&P (Signed)
NAME:  [name], [name]                      ACCOUNT NO.:  [id number]   MEDICAL RECORD NO.:  [id number]                   PATIENT TYPE:  INP   LOCATION:  9167                                 [facility]   PHYSICIAN:  Hal Morales, M.D.             DATE OF BIRTH:  Jun 04, 1980   DATE OF ADMISSION:  06/15/2003  DATE OF DISCHARGE:                                HISTORY & PHYSICAL   HISTORY OF PRESENT ILLNESS:  Ms. Coddington is a gravida 2, para 0, 0, 1, 0 at  40-5/7ths weeks who presents with spontaneous rupture of membranes at  approximately 11:30 P.M. with clear fluid noted and cramping also noted.  She denies headache, visual symptoms and epigastric pain.  She reports  positive fetal movement.   Pregnancy has been remarkable for:  1. History of elevated blood pressure in the last one to two weeks with a     negative preeclamptic evaluation on June 05, 2003.  2. Questionable dates.  3. Asthma.  4. History of Chlamydia.  5. Family history of polydactyly.   PRENATAL LABORATORY DATA:  Blood type is A positive, Rh antibody negative.  VDRL nonreactive.  Rubella titer positive.  Hepatitis B surface antigen  negative.  HIV nonreactive.  Sickle cell test negative.  GC and Chlamydia  cultures were negative.  Pap was normal.  Glucose challenge was normal.  AFP  was noted to be abnormal, she had normal anatomy.  Amnio was declined.  Hemoglobin upon entering the practice was 10.7; it was 10 at 26 weeks.  EDC  of June 10, 2003 was established by 14-week ultrasound secondary to  questionable LMP.  Group B Strep culture was negative at 36 weeks.  GC and  Chlamydia cultures were also negative as noted.   HISTORY OF PRESENT PREGNANCY:  The patient entered care at approximately 15  weeks.  She had seen her primary prior to that time for asthma management  and was out on Pulmicort, albuterol inhaler, Claritin and a nasal spray.  She was found to have an abnormal quadruple screen with  normal ultrasound at  19 weeks.  She had another ultrasound at 26 weeks that again showed normal  findings.  She had another ultrasound a 32 weeks that showed breech  presentation, but normal growth at the 64th percentile.  She had some upper  respiratory symptoms at 34 weeks and was sent to the maternity admissions  unit for some fluids secondary to feeling poorly.  At that time her blood  pressure was 130/90 and she was evaluated in maternity admissions with  normal findings.  Her blood pressure was within normal limits at 35 weeks.  She had another ultrasound at approximately 35 weeks with normal findings;  weight in the 75th-90th percentile.  At 39 weeks she had elevated blood  pressure of 140/98 noted on her office visit.  She was sent to maternity  admissions for [facility] workup, which  was negative.  Blood pressures had  normalized with rest after the serial evaluations in the MAU.  She was seen  again at 39-5/7ths weeks, again with blood pressures 140/100, but then  136/82 after 10 minutes.  She had another evaluation this week on the 11th  with no blood pressure elevation.  Cervix at that time was 1 cm.   PAST OBSTETRICAL HISTORY:  In 2003 she had a therapeutic termination of  pregnancy at eight weeks gestation with no problems.   PAST MEDICAL HISTORY:  The patient was a previous oral contraceptive and  patch user for two years.  In October 2003 she was treated for Chlamydia and  her partner was also treated.  She reports the usual childhood illnesses.  She has occasional yeast infections.  The patient was diagnosed with asthma  as an infant, but had not had any problems prior to pregnancy.  Her care was  provided by Dr. Idolina Primer.  The patient has had one UTI and was hospitalized  at 84 months of age for asthma.   ALLERGIES:  The patient is allergic to ASPIRIN, which causes a rash, but she  can take ibuprofen.   FAMILY HISTORY:  The patient's maternal grandmother had four heart  attacks.  Her mother, sister,  maternal grandfather and maternal grandmother are  hypertensive on medication.  Maternal grandmother is an adult onset insulin-  dependent diabetic.  Heart rate mother has adult onset diabetes.  Maternal  aunt had Hodgkin's lymphoma.  Mother is bipolar and has depression.  Mother,  father and father of the baby are all smokers.   GENETIC HISTORY:  Genetic history is remarkable for the patient's younger  sister born with an extra digit.  Father of the baby's niece was tongue tied  to the frenulum.   SOCIAL HISTORY:  The patient is single.  The father of the baby is involved  and supportive; his name is Sharlet Salina.  he is present with her today.  The patient has high school and some additional education.  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  Her partner has a 10th grade education; he is currently  unemployed.  She has been followed by the Physician Service at [facility].  She denies any alcohol, drug or tobacco use during this  pregnancy.   PHYSICAL EXAMINATION:  VITAL SIGNS:  Initial blood pressures are 137/93 and  150/100.  Other vital signs are stable.  HEENT:  Within normal limits.  LUNGS:  Bilateral breath sounds are clear.  HEART:  Regular rate and rhythm without murmur.  BREASTS:  Breasts are soft and nontender.  ABDOMEN:  Fundal height is approximately 38 cm.  Estimated fetal weight is 7-  7.5 pounds.  Uterine contractions every two to three minutes of mild-to-  moderate quality.  PELVIC EXAMINATION:  The patient is noted to be leaking clear fluid.  It is  positive nitrazine.  Cervix is 1 cm, 75%, vertex and a -1 to a -2 station.  Electronic fetal monitoring reveals reactive tracing with no decelerations.  EXTREMITIES:  Deep tendon reflexes are 2+ without clonus.  There is no edema  noted.   IMPRESSION:  1. Intrauterine pregnancy at 40-5/7ths weeks.  2. Early labor with spontaneous rupture of membranes and clear fluid. 3. Elevated  blood pressure.  4. Negative group B Streptococcus.   PLAN:  1. Admit to birthing suite for consult with Dr. Pennie Rushing as the attending     physician.  2. Routine  physician orders.  3. Plan PIH labs and cathed UA.  4. M.D. will follow.     Renaldo Reel Emilee Hero, C.N.M.                   Hal Morales, M.D.    Leeanne Mannan  D:  06/15/2003  T:  06/15/2003  Job:  [id number]

## 2011-02-26 LAB — CBC
HCT: 27.7 — ABNORMAL LOW
HCT: 29.7 — ABNORMAL LOW
Hemoglobin: 10.2 — ABNORMAL LOW
MCHC: 34.4
MCHC: 34.9
MCV: 83.7
MCV: 85.1
Platelets: 181
RBC: 3.54 — ABNORMAL LOW
RDW: 14.4

## 2011-02-26 LAB — RPR: RPR Ser Ql: NONREACTIVE

## 2011-03-10 LAB — URINALYSIS, ROUTINE W REFLEX MICROSCOPIC
Bilirubin Urine: NEGATIVE
Ketones, ur: NEGATIVE
Nitrite: NEGATIVE
Protein, ur: NEGATIVE
Urobilinogen, UA: 0.2

## 2011-03-10 LAB — WET PREP, GENITAL

## 2011-03-10 LAB — GC/CHLAMYDIA PROBE AMP, GENITAL: GC Probe Amp, Genital: NEGATIVE

## 2011-08-26 ENCOUNTER — Inpatient Hospital Stay (HOSPITAL_COMMUNITY)
Admission: AD | Admit: 2011-08-26 | Discharge: 2011-08-26 | Disposition: A | Discharge status: Home / Self Care | Payer: Medicaid Other | Source: Ambulatory Visit | Attending: Obstetrics & Gynecology | Admitting: Obstetrics & Gynecology

## 2011-08-26 ENCOUNTER — Encounter (HOSPITAL_COMMUNITY): Payer: Self-pay | Admitting: *Deleted

## 2011-08-26 DIAGNOSIS — R102 Pelvic and perineal pain: Secondary | ICD-10-CM

## 2011-08-26 DIAGNOSIS — B9689 Other specified bacterial agents as the cause of diseases classified elsewhere: Secondary | ICD-10-CM | POA: Insufficient documentation

## 2011-08-26 DIAGNOSIS — N949 Unspecified condition associated with female genital organs and menstrual cycle: Secondary | ICD-10-CM | POA: Insufficient documentation

## 2011-08-26 DIAGNOSIS — R109 Unspecified abdominal pain: Secondary | ICD-10-CM | POA: Insufficient documentation

## 2011-08-26 DIAGNOSIS — A499 Bacterial infection, unspecified: Secondary | ICD-10-CM

## 2011-08-26 DIAGNOSIS — N760 Acute vaginitis: Secondary | ICD-10-CM

## 2011-08-26 LAB — URINALYSIS, ROUTINE W REFLEX MICROSCOPIC
Leukocytes, UA: NEGATIVE
Protein, ur: NEGATIVE mg/dL
Urobilinogen, UA: 0.2 mg/dL (ref 0.0–1.0)

## 2011-08-26 LAB — CBC
Platelets: 231 10*3/uL (ref 150–400)
RDW: 13.2 % (ref 11.5–15.5)
WBC: 7 10*3/uL (ref 4.0–10.5)

## 2011-08-26 LAB — WET PREP, GENITAL: Trich, Wet Prep: NONE SEEN

## 2011-08-26 MED ORDER — NAPROXEN SODIUM 550 MG PO TABS: 550.0000 mg | ORAL_TABLET | Freq: Two times a day (BID) | ORAL | Status: DC

## 2011-08-26 MED ORDER — METRONIDAZOLE 500 MG PO TABS: 500.0000 mg | ORAL_TABLET | Freq: Two times a day (BID) | ORAL | Status: AC

## 2011-08-26 NOTE — MAU Provider Note (Signed)
History   Pt presents today c/o lower abd pain that radiates to her back. She states the pain has been present for the past 2-3wks. She has also noticed a vag dc. She denies dysuria, fever, or any other sx at this time. She has had a prior BTL and has been using Depo-provera to control her menses.  CSN: [id number]  Arrival date and time: 08/26/11 2049   First Provider Initiated Contact with Patient 08/26/11 2150      Chief Complaint  Patient presents with  . Abdominal Pain   HPI  OB History    Grav Para Term Preterm Abortions TAB SAB Ect Mult Living   3 3        3       Past Medical History  Diagnosis Date  . Asthma     Past Surgical History  Procedure Date  . Cesarean section     History reviewed. No pertinent family history.  History  Substance Use Topics  . Smoking status: Never Smoker   . Smokeless tobacco: Not on file  . Alcohol Use: No    Allergies: No Known Allergies  No prescriptions prior to admission    Review of Systems  Constitutional: Negative for fever and chills.  Eyes: Negative for blurred vision and double vision.  Cardiovascular: Negative for chest pain and palpitations.  Gastrointestinal: Positive for abdominal pain. Negative for nausea, vomiting, diarrhea and constipation.  Genitourinary: Negative for dysuria, urgency, frequency and hematuria.  Neurological: Negative for dizziness and headaches.  Psychiatric/Behavioral: Negative for depression and suicidal ideas.   Physical Exam   Blood pressure 128/77, pulse 78, temperature 98.6 F (37 C), resp. rate 18, height 5' 3.5" (1.613 m), weight 131 lb 2 oz (59.478 kg).  Physical Exam  Nursing note and vitals reviewed. Constitutional: She is oriented to person, place, and time. She appears well-developed and well-nourished. No distress.  HENT:  Head: Normocephalic and atraumatic.  Eyes: EOM are normal. Pupils are equal, round, and reactive to light.  GI: Soft. She exhibits no distension and  no mass. There is no tenderness. There is no rebound and no guarding.  Genitourinary: No bleeding around the vagina. Vaginal discharge found.       Yellowish vag dc present in the vault. Her cervix is Lg/closed. Uterus NL size and shape. No adnexal masses. Pt non-tender on exam.  Neurological: She is alert and oriented to person, place, and time.  Skin: Skin is warm and dry. She is not diaphoretic.  Psychiatric: She has a normal mood and affect. Her behavior is normal. Judgment and thought content normal.    MAU Course  Procedures  Wet prep and GC/Chlamydia cultures done.  Results for orders placed during the hospital encounter of 08/26/11 (from the past 24 hour(s))  URINALYSIS, ROUTINE W REFLEX MICROSCOPIC     Status: Abnormal   Collection Time   08/26/11  9:01 PM      Component Value Range   Color, Urine YELLOW  YELLOW    APPearance CLEAR  CLEAR    Specific Gravity, Urine >1.030 (*) 1.005 - 1.030    pH 5.5  5.0 - 8.0    Glucose, UA NEGATIVE  NEGATIVE (mg/dL)   Hgb urine dipstick NEGATIVE  NEGATIVE    Bilirubin Urine NEGATIVE  NEGATIVE    Ketones, ur NEGATIVE  NEGATIVE (mg/dL)   Protein, ur NEGATIVE  NEGATIVE (mg/dL)   Urobilinogen, UA 0.2  0.0 - 1.0 (mg/dL)   Nitrite NEGATIVE  NEGATIVE  Leukocytes, UA NEGATIVE  NEGATIVE   WET PREP, GENITAL     Status: Abnormal   Collection Time   08/26/11  9:45 PM      Component Value Range   Yeast Wet Prep HPF POC NONE SEEN  NONE SEEN    Trich, Wet Prep NONE SEEN  NONE SEEN    Clue Cells Wet Prep HPF POC MODERATE (*) NONE SEEN    WBC, Wet Prep HPF POC FEW (*) NONE SEEN   CBC     Status: Normal   Collection Time   08/26/11  9:55 PM      Component Value Range   WBC 7.0  4.0 - 10.5 (K/uL)   RBC 4.34  3.87 - 5.11 (MIL/uL)   Hemoglobin 13.2  12.0 - 15.0 (g/dL)   HCT 40.9  81.1 - 91.4 (%)   MCV 89.9  78.0 - 100.0 (fL)   MCH 30.4  26.0 - 34.0 (pg)   MCHC 33.8  30.0 - 36.0 (g/dL)   RDW 78.2  95.6 - 21.3 (%)   Platelets 231  150 - 400  (K/uL)     Assessment and Plan  BV: discussed with pt at length. Will tx with Flagyl. Warned of antabuse reaction.  Pelvic pain: uncertain etiology at this time. No evidence of compromising process. She will f/u with Dr. Tamela Oddi. Will give Rx for Anaprox DS. Discussed diet, activity, risks, and precautions.  Clinton Gallant. Brennah Quraishi III, DrHSc, MPAS, PA-C  08/26/2011, 10:13 PM

## 2011-08-26 NOTE — MAU Note (Signed)
Pt reports she has had lower abd pain x 3 days, has not taken meds at home for the pain. Has had a BTL and had a shot of Depo in January so has not had a period since then.denies dysuria.

## 2011-08-26 NOTE — Discharge Instructions (Signed)
Bacterial Vaginosis Bacterial vaginosis (BV) is a vaginal infection where the normal balance of bacteria in the vagina is disrupted. The normal balance is then replaced by an overgrowth of certain bacteria. There are several different kinds of bacteria that can cause BV. BV is the most common vaginal infection in women of childbearing age. CAUSES   The cause of BV is not fully understood. BV develops when there is an increase or imbalance of harmful bacteria.   Some activities or behaviors can upset the normal balance of bacteria in the vagina and put women at increased risk including:   Having a new sex partner or multiple sex partners.   Douching.   Using an intrauterine device (IUD) for contraception.   It is not clear what role sexual activity plays in the development of BV. However, women that have never had sexual intercourse are rarely infected with BV.  Women do not get BV from toilet seats, bedding, swimming pools or from touching objects around them.  SYMPTOMS   Grey vaginal discharge.   A fish-like odor with discharge, especially after sexual intercourse.   Itching or burning of the vagina and vulva.   Burning or pain with urination.   Some women have no signs or symptoms at all.  DIAGNOSIS  Your caregiver must examine the vagina for signs of BV. Your caregiver will perform lab tests and look at the sample of vaginal fluid through a microscope. They will look for bacteria and abnormal cells (clue cells), a pH test higher than 4.5, and a positive amine test all associated with BV.  RISKS AND COMPLICATIONS   Pelvic inflammatory disease (PID).   Infections following gynecology surgery.   Developing HIV.   Developing herpes virus.  TREATMENT  Sometimes BV will clear up without treatment. However, all women with symptoms of BV should be treated to avoid complications, especially if gynecology surgery is planned. Female partners generally do not need to be treated. However,  BV may spread between female sex partners so treatment is helpful in preventing a recurrence of BV.   BV may be treated with antibiotics. The antibiotics come in either pill or vaginal cream forms. Either can be used with nonpregnant or pregnant women, but the recommended dosages differ. These antibiotics are not harmful to the baby.   BV can recur after treatment. If this happens, a second round of antibiotics will often be prescribed.   Treatment is important for pregnant women. If not treated, BV can cause a premature delivery, especially for a pregnant woman who had a premature birth in the past. All pregnant women who have symptoms of BV should be checked and treated.   For chronic reoccurrence of BV, treatment with a type of prescribed gel vaginally twice a week is helpful.  HOME CARE INSTRUCTIONS   Finish all medication as directed by your caregiver.   Do not have sex until treatment is completed.   Tell your sexual partner that you have a vaginal infection. They should see their caregiver and be treated if they have problems, such as a mild rash or itching.   Practice safe sex. Use condoms. Only have 1 sex partner.  PREVENTION  Basic prevention steps can help reduce the risk of upsetting the natural balance of bacteria in the vagina and developing BV:  Do not have sexual intercourse (be abstinent).   Do not douche.   Use all of the medicine prescribed for treatment of BV, even if the signs and symptoms go away.     Tell your sex partner if you have BV. That way, they can be treated, if needed, to prevent reoccurrence.  SEEK MEDICAL CARE IF:   Your symptoms are not improving after 3 days of treatment.   You have increased discharge, pain, or fever.  MAKE SURE YOU:   Understand these instructions.   Will watch your condition.   Will get help right away if you are not doing well or get worse.  FOR MORE INFORMATION  Division of STD Prevention (DSTDP), Centers for Disease  Control and Prevention: www.cdc.gov/std American [employer] Health Association (ASHA): www.ashastd.org  Document Released: 05/18/2005 Document Revised: 05/07/2011 Document Reviewed: 11/08/2008 ExitCare Patient Information 2012 ExitCare, LLCAbdominal Pain, Women Abdominal (stomach, pelvic, or belly) pain can be caused by many things. It is important to tell your doctor:  The location of the pain.   Does it come and go or is it present all the time?   Are there things that start the pain (eating certain foods, exercise)?   Are there other symptoms associated with the pain (fever, nausea, vomiting, diarrhea)?  All of this is helpful to know when trying to find the cause of the pain. CAUSES   Stomach: virus or bacteria infection, or ulcer.   Intestine: appendicitis (inflamed appendix), regional ileitis (Crohn's disease), ulcerative colitis (inflamed colon), irritable bowel syndrome, diverticulitis (inflamed diverticulum of the colon), or cancer of the stomach or intestine.   Gallbladder disease or stones in the gallbladder.   Kidney disease, kidney stones, or infection.   Pancreas infection or cancer.   Fibromyalgia (pain disorder).   Diseases of the female organs:   Uterus: fibroid (non-cancerous) tumors or infection.   Fallopian tubes: infection or tubal pregnancy.   Ovary: cysts or tumors.   Pelvic adhesions (scar tissue).   Endometriosis (uterus lining tissue growing in the pelvis and on the pelvic organs).   Pelvic congestion syndrome (female organs filling up with blood just before the menstrual period).   Pain with the menstrual period.   Pain with ovulation (producing an egg).   Pain with an IUD (intrauterine device, birth control) in the uterus.   Cancer of the female organs.   Functional pain (pain not caused by a disease, may improve without treatment).   Psychological pain.   Depression.  DIAGNOSIS  Your doctor will decide the seriousness of your pain by  doing an examination.  Blood tests.   X-rays.   Ultrasound.   CT scan (computed tomography, special type of X-ray).   MRI (magnetic resonance imaging).   Cultures, for infection.   Barium enema (dye inserted in the large intestine, to better view it with X-rays).   Colonoscopy (looking in intestine with a lighted tube).   Laparoscopy (minor surgery, looking in abdomen with a lighted tube).   Major abdominal exploratory surgery (looking in abdomen with a large incision).  TREATMENT  The treatment will depend on the cause of the pain.   Many cases can be observed and treated at home.   Over-the-counter medicines recommended by your caregiver.   Prescription medicine.   Antibiotics, for infection.   Birth control pills, for painful periods or for ovulation pain.   Hormone treatment, for endometriosis.   Nerve blocking injections.   Physical therapy.   Antidepressants.   Counseling with a psychologist or psychiatrist.   Minor or major surgery.  HOME CARE INSTRUCTIONS   Do not take laxatives, unless directed by your caregiver.   Take over-the-counter pain medicine only if ordered by your   caregiver. Do not take aspirin because it can cause an upset stomach or bleeding.   Try a clear liquid diet (broth or water) as ordered by your caregiver. Slowly move to a bland diet, as tolerated, if the pain is related to the stomach or intestine.   Have a thermometer and take your temperature several times a day, and record it.   Bed rest and sleep, if it helps the pain.   Avoid sexual intercourse, if it causes pain.   Avoid stressful situations.   Keep your follow-up appointments and tests, as your caregiver orders.   If the pain does not go away with medicine or surgery, you may try:   Acupuncture.   Relaxation exercises (yoga, meditation).   Group therapy.   Counseling.  SEEK MEDICAL CARE IF:   You notice certain foods cause stomach pain.   Your home care  treatment is not helping your pain.   You need stronger pain medicine.   You want your IUD removed.   You feel faint or lightheaded.   You develop nausea and vomiting.   You develop a rash.   You are having side effects or an allergy to your medicine.  SEEK IMMEDIATE MEDICAL CARE IF:   Your pain does not go away or gets worse.   You have a fever.   Your pain is felt only in portions of the abdomen. The right side could possibly be appendicitis. The left lower portion of the abdomen could be colitis or diverticulitis.   You are passing blood in your stools (bright red or black tarry stools, with or without vomiting).   You have blood in your urine.   You develop chills, with or without a fever.   You pass out.  MAKE SURE YOU:   Understand these instructions.   Will watch your condition.   Will get help right away if you are not doing well or get worse.  Document Released: 03/15/2007 Document Revised: 05/07/2011 Document Reviewed: 04/04/2009 ExitCare Patient Information 2012 [employer] 

## 2011-08-27 LAB — POCT PREGNANCY, URINE: Preg Test, Ur: NEGATIVE

## 2011-11-05 ENCOUNTER — Emergency Department (HOSPITAL_COMMUNITY)
Admission: EM | Admit: 2011-11-05 | Discharge: 2011-11-05 | Disposition: A | Discharge status: Home / Self Care | Payer: Self-pay | Attending: Emergency Medicine | Admitting: Emergency Medicine

## 2011-11-05 ENCOUNTER — Emergency Department (HOSPITAL_COMMUNITY): Payer: Self-pay

## 2011-11-05 ENCOUNTER — Encounter (HOSPITAL_COMMUNITY): Payer: Self-pay | Admitting: Emergency Medicine

## 2011-11-05 DIAGNOSIS — J45909 Unspecified asthma, uncomplicated: Secondary | ICD-10-CM | POA: Insufficient documentation

## 2011-11-05 DIAGNOSIS — R079 Chest pain, unspecified: Secondary | ICD-10-CM | POA: Insufficient documentation

## 2011-11-05 LAB — DIFFERENTIAL
Basophils Relative: 0 % (ref 0–1)
Lymphs Abs: 1.9 10*3/uL (ref 0.7–4.0)
Monocytes Absolute: 0.4 10*3/uL (ref 0.1–1.0)
Monocytes Relative: 6 % (ref 3–12)
Neutro Abs: 4.2 10*3/uL (ref 1.7–7.7)

## 2011-11-05 LAB — CBC
HCT: 41.3 % (ref 36.0–46.0)
Hemoglobin: 14.1 g/dL (ref 12.0–15.0)
MCH: 31.1 pg (ref 26.0–34.0)
MCHC: 34.1 g/dL (ref 30.0–36.0)

## 2011-11-05 LAB — BASIC METABOLIC PANEL
BUN: 18 mg/dL (ref 6–23)
CO2: 23 mEq/L (ref 19–32)
Chloride: 106 mEq/L (ref 96–112)
Creatinine, Ser: 1.33 mg/dL — ABNORMAL HIGH (ref 0.50–1.10)
GFR calc Af Amer: 62 mL/min — ABNORMAL LOW (ref >90)
Glucose, Bld: 85 mg/dL (ref 70–99)

## 2011-11-05 NOTE — ED Notes (Signed)
Pt denies any questions upon discharge. 

## 2011-11-05 NOTE — ED Notes (Signed)
PT. REPORTS INTERMITTENT MID / LEFT CHEST PAIN WITH SOB AND DRY COUGH / VOMITTING ONSET 1 WEEK AGO .

## 2011-11-05 NOTE — Discharge Instructions (Signed)
Chest Pain (Nonspecific) It is often hard to give a specific diagnosis for the cause of chest pain. There is always a chance that your pain could be related to something serious, such as a heart attack or a blood clot in the lungs. You need to follow up with your caregiver for further evaluation. CAUSES   Heartburn.   Pneumonia or bronchitis.   Anxiety or stress.   Inflammation around your heart (pericarditis) or lung (pleuritis or pleurisy).   A blood clot in the lung.   A collapsed lung (pneumothorax). It can develop suddenly on its own (spontaneous pneumothorax) or from injury (trauma) to the chest.   Shingles infection (herpes zoster virus).  The chest wall is composed of bones, muscles, and cartilage. Any of these can be the source of the pain.  The bones can be bruised by injury.   The muscles or cartilage can be strained by coughing or overwork.   The cartilage can be affected by inflammation and become sore (costochondritis).  DIAGNOSIS  Lab tests or other studies, such as X-rays, electrocardiography, stress testing, or cardiac imaging, may be needed to find the cause of your pain.  TREATMENT   Treatment depends on what may be causing your chest pain. Treatment may include:   Acid blockers for heartburn.   Anti-inflammatory medicine.   Pain medicine for inflammatory conditions.   Antibiotics if an infection is present.   You may be advised to change lifestyle habits. This includes stopping smoking and avoiding alcohol, caffeine, and chocolate.   You may be advised to keep your head raised (elevated) when sleeping. This reduces the chance of acid going backward from your stomach into your esophagus.   Most of the time, nonspecific chest pain will improve within 2 to 3 days with rest and mild pain medicine.  HOME CARE INSTRUCTIONS   If antibiotics were prescribed, take your antibiotics as directed. Finish them even if you start to feel better.   For the next few  days, avoid physical activities that bring on chest pain. Continue physical activities as directed.   Do not smoke.   Avoid drinking alcohol.   Only take over-the-counter or prescription medicine for pain, discomfort, or fever as directed by your caregiver.   Follow your caregiver's suggestions for further testing if your chest pain does not go away.   Keep any follow-up appointments you made. If you do not go to an appointment, you could develop lasting (chronic) problems with pain. If there is any problem keeping an appointment, you must call to reschedule.  SEEK MEDICAL CARE IF:   You think you are having problems from the medicine you are taking. Read your medicine instructions carefully.   Your chest pain does not go away, even after treatment.   You develop a rash with blisters on your chest.  SEEK IMMEDIATE MEDICAL CARE IF:   You have increased chest pain or pain that spreads to your arm, neck, jaw, back, or abdomen.   You develop shortness of breath, an increasing cough, or you are coughing up blood.   You have severe back or abdominal pain, feel nauseous, or vomit.   You develop severe weakness, fainting, or chills.   You have a fever.  THIS IS AN EMERGENCY. Do not wait to see if the pain will go away. Get medical help at once. Call your local emergency services (911 in [place] Do not drive yourself to the hospital. MAKE SURE YOU:   Understand these instructions.     Will watch your condition.   Will get help right away if you are not doing well or get worse.  Document Released: 02/25/2005 Document Revised: 05/07/2011 Document Reviewed: 12/22/2007 ExitCare Patient Information 2012 [employer]. 

## 2011-11-05 NOTE — ED Provider Notes (Signed)
History     CSN: [id number]  Arrival date & time 11/05/11  4098   First MD Initiated Contact with Patient 11/05/11 2143      Chief Complaint  Patient presents with  . Chest Pain    (Consider location/radiation/quality/duration/timing/severity/associated sxs/prior treatment) HPI Comments: Patient reports intermittent bilateral upper chest pain over the last week.  There's no specific inciting or relieving factors.  Patient notes her shortness of breath with it.  She does not feel that it is similar to her past asthma exacerbations.  She's had no fevers, cough or URI type symptoms.  Patient notes no changes with eating.  She denies any vomiting or diarrhea to me.  Is not associated with stress or palpitations.  Is no radiation of her symptoms.  Patient is a 31 y.o. female presenting with chest pain. The history is provided by the patient. No language interpreter was used.  Chest Pain The chest pain began 5 - 7 days ago. Primary symptoms include shortness of breath. Pertinent negatives for primary symptoms include no fever, no cough, no abdominal pain, no nausea and no vomiting.     Past Medical History  Diagnosis Date  . Asthma     Past Surgical History  Procedure Date  . Cesarean section     History reviewed. No pertinent family history.  History  Substance Use Topics  . Smoking status: Never Smoker   . Smokeless tobacco: Not on file  . Alcohol Use: No    OB History    Grav Para Term Preterm Abortions TAB SAB Ect Mult Living   3 3        3       Review of Systems  Constitutional: Negative.  Negative for fever and chills.  HENT: Negative.   Eyes: Negative.  Negative for discharge.  Respiratory: Positive for shortness of breath. Negative for cough.   Cardiovascular: Positive for chest pain.  Gastrointestinal: Negative.  Negative for nausea, vomiting, abdominal pain and diarrhea.  Genitourinary: Negative.   Musculoskeletal: Negative.  Negative for back pain.  Skin:  Negative.  Negative for color change and rash.  Neurological: Negative.  Negative for syncope and headaches.  Hematological: Negative.  Negative for adenopathy.  Psychiatric/Behavioral: Negative.  Negative for confusion.  All other systems reviewed and are negative.    Allergies  Review of patient's allergies indicates no known allergies.  Home Medications  No current outpatient prescriptions on file.  BP 119/84  Pulse 77  Temp(Src) 98 F (36.7 C) (Oral)  Resp 18  SpO2 98%  LMP 10/30/2011  Physical Exam  Nursing note and vitals reviewed. Constitutional: She is oriented to person, place, and time. She appears well-developed and well-nourished.  Non-toxic appearance. She does not have a sickly appearance.  HENT:  Head: Normocephalic and atraumatic.  Eyes: Conjunctivae, EOM and lids are normal. Pupils are equal, round, and reactive to light. No scleral icterus.  Neck: Trachea normal and normal range of motion. Neck supple.  Cardiovascular: Normal rate, regular rhythm and normal heart sounds.  Exam reveals no gallop.   No murmur heard. Pulmonary/Chest: Effort normal and breath sounds normal. No respiratory distress. She has no wheezes. She has no rales. She exhibits no tenderness.  Abdominal: Soft. Normal appearance. There is no tenderness. There is no rebound, no guarding and no CVA tenderness.  Musculoskeletal: Normal range of motion.  Neurological: She is alert and oriented to person, place, and time. She has normal strength.  Skin: Skin is warm, dry and intact.  No rash noted.  Psychiatric: She has a normal mood and affect. Her behavior is normal. Judgment and thought content normal.    ED Course  Procedures (including critical care time)   Labs Reviewed  CBC  DIFFERENTIAL  POCT I-STAT TROPONIN I  BASIC METABOLIC PANEL   Dg Chest 2 View  11/05/2011  *RADIOLOGY REPORT*  Clinical Data: Chest pain, cough, asthma.  CHEST - 2 VIEW  Comparison: 04/29/2007  Findings: Heart  and mediastinal contours are within normal limits. No focal opacities or effusions.  No acute bony abnormality.  IMPRESSION: No active cardiopulmonary disease.  Original Report Authenticated By: Cyndie Chime, M.D.      Date: 11/05/2011  Rate: 77  Rhythm: normal sinus rhythm  QRS Axis: normal  Intervals: normal  ST/T Wave abnormalities: normal  Conduction Disutrbances:none  Narrative Interpretation:   Old EKG Reviewed: unchanged from 04-15-00    MDM  Patient with upper chest pain and shortness of breath that is of unclear etiology.  She has a normal EKG.  She has no specific infectious symptoms to suggest bronchitis, URI or pneumonia.  She has no symptoms with eating to suggest that there is any difficulty with swallowing or acid reflux.  Patient is clear lungs on exam and so does not appear to have an asthma exacerbation.  She has normal vital signs at this time and is not showing signs of hypoxia or tachycardia to suggest pulmonary embolism at this time.  By [facility] criteria this patient is negative.  While I have an unclear etiology for the patient's intermittent symptoms I do not feel there is something serious or life-threatening at this time and she is safe for discharge home.        Nat Christen, MD 11/05/11 2156

## 2012-07-28 ENCOUNTER — Inpatient Hospital Stay (HOSPITAL_COMMUNITY)
Admission: AD | Admit: 2012-07-28 | Discharge: 2012-07-28 | Disposition: A | Discharge status: Home / Self Care | Payer: Self-pay | Source: Ambulatory Visit | Attending: Obstetrics | Admitting: Obstetrics

## 2012-07-28 ENCOUNTER — Encounter (HOSPITAL_COMMUNITY): Payer: Self-pay | Admitting: *Deleted

## 2012-07-28 DIAGNOSIS — K5289 Other specified noninfective gastroenteritis and colitis: Secondary | ICD-10-CM | POA: Insufficient documentation

## 2012-07-28 DIAGNOSIS — Z3202 Encounter for pregnancy test, result negative: Secondary | ICD-10-CM | POA: Insufficient documentation

## 2012-07-28 DIAGNOSIS — R109 Unspecified abdominal pain: Secondary | ICD-10-CM | POA: Insufficient documentation

## 2012-07-28 DIAGNOSIS — R197 Diarrhea, unspecified: Secondary | ICD-10-CM

## 2012-07-28 DIAGNOSIS — R112 Nausea with vomiting, unspecified: Secondary | ICD-10-CM

## 2012-07-28 LAB — WET PREP, GENITAL
Clue Cells Wet Prep HPF POC: NONE SEEN
Trich, Wet Prep: NONE SEEN
Yeast Wet Prep HPF POC: NONE SEEN

## 2012-07-28 LAB — CBC WITH DIFFERENTIAL/PLATELET
Basophils Relative: 0 % (ref 0–1)
Eosinophils Absolute: 0.2 10*3/uL (ref 0.0–0.7)
Eosinophils Relative: 5 % (ref 0–5)
MCH: 30.1 pg (ref 26.0–34.0)
MCHC: 33.5 g/dL (ref 30.0–36.0)
MCV: 89.8 fL (ref 78.0–100.0)
Neutrophils Relative %: 55 % (ref 43–77)
Platelets: 184 10*3/uL (ref 150–400)
RDW: 13.3 % (ref 11.5–15.5)

## 2012-07-28 LAB — URINALYSIS, ROUTINE W REFLEX MICROSCOPIC
Ketones, ur: NEGATIVE mg/dL
Leukocytes, UA: NEGATIVE
Protein, ur: NEGATIVE mg/dL
Urobilinogen, UA: 0.2 mg/dL (ref 0.0–1.0)

## 2012-07-28 MED ORDER — IBUPROFEN 400 MG PO TABS
400.0000 mg | ORAL_TABLET | Freq: Once | ORAL | Status: AC
  Administered 2012-07-28: 400 mg via ORAL
  Filled 2012-07-28: qty 1

## 2012-07-28 NOTE — MAU Provider Note (Signed)
History     CSN: [id number]  Arrival date and time: 07/28/12 1304   First Provider Initiated Contact with Patient 07/28/12 1408      Chief Complaint  Patient presents with  . Abdominal Pain   HPI [name] [name] is 32 y.o. [phone] Unknown weeks presenting with lower abdominal cramping X 3 days and then this am she had nausea, vomiting X1 and diarrheaX2.  Denies exposure to illness.  Has not taken anything for the sxs today.  Took ibuprofen yesterday that took the edge off.  On Depo with last injection in January.  Had discharge with a little blood in it yesterday none today.  Last sexual activity 3 weeks ago. Patient of Dr. Marcia Brash.  She did not call the office because she did not have the required co-pay.    Past Medical History  Diagnosis Date  . Asthma     Past Surgical History  Procedure Laterality Date  . Cesarean section    . Tubal ligation      Family History  Problem Relation Age of Onset  . Diabetes Mother   . Hypertension Mother   . Hypertension Sister   . Stroke Maternal Grandmother   . Hypertension Maternal Grandmother   . Diabetes Maternal Grandmother   . Diabetes Maternal Grandfather     History  Substance Use Topics  . Smoking status: Never Smoker   . Smokeless tobacco: Never Used  . Alcohol Use: Yes     Comment: occaisional- not daily    Allergies: No Known Allergies  No prescriptions prior to admission    Review of Systems  Constitutional: Negative for fever and chills.  HENT: Negative.   Respiratory: Negative.   Cardiovascular: Negative.   Gastrointestinal: Positive for nausea, vomiting, abdominal pain (cramping) and diarrhea. Negative for constipation.  Genitourinary: Negative.   Musculoskeletal: Negative.    Physical Exam   Blood pressure 123/87, pulse 79, temperature 98.1 F (36.7 C), temperature source Oral, resp. rate 16, height 5\' 5"  (1.651 m), weight 138 lb (62.596 kg), SpO2 100.00%.  Physical Exam   Constitutional: She is oriented to person, place, and time. She appears well-developed and well-nourished. No distress.  HENT:  Head: Normocephalic.  Neck: Normal range of motion.  Cardiovascular: Normal rate.   Respiratory: Effort normal.  GI: Soft. She exhibits no distension and no mass. There is tenderness (mild tenderness in the mid lower abdomen). There is no rebound and no guarding.  Genitourinary: There is no rash, tenderness or lesion on the right labia. There is no rash, tenderness or lesion on the left labia. Uterus is not enlarged and not tender. Cervix exhibits no discharge and no friability. Right adnexum displays no mass, no tenderness and no fullness. Left adnexum displays no mass, no tenderness and no fullness. No erythema, tenderness or bleeding around the vagina. No vaginal discharge found.  Neurological: She is alert and oriented to person, place, and time.  Skin: Skin is warm and dry.  Psychiatric: She has a normal mood and affect. Her behavior is normal.   Results for orders placed during the hospital encounter of 07/28/12 (from the past 24 hour(s))  URINALYSIS, ROUTINE W REFLEX MICROSCOPIC     Status: None   Collection Time    07/28/12  1:15 PM      Result Value Range   Color, Urine YELLOW  YELLOW   APPearance CLEAR  CLEAR   Specific Gravity, Urine 1.025  1.005 - 1.030   pH 6.0  5.0 -  8.0   Glucose, UA NEGATIVE  NEGATIVE mg/dL   Hgb urine dipstick NEGATIVE  NEGATIVE   Bilirubin Urine NEGATIVE  NEGATIVE   Ketones, ur NEGATIVE  NEGATIVE mg/dL   Protein, ur NEGATIVE  NEGATIVE mg/dL   Urobilinogen, UA 0.2  0.0 - 1.0 mg/dL   Nitrite NEGATIVE  NEGATIVE   Leukocytes, UA NEGATIVE  NEGATIVE  POCT PREGNANCY, URINE     Status: None   Collection Time    07/28/12  1:38 PM      Result Value Range   Preg Test, Ur NEGATIVE  NEGATIVE  CBC WITH DIFFERENTIAL     Status: None   Collection Time    07/28/12  2:15 PM      Result Value Range   WBC 5.3  4.0 - 10.5 K/uL   RBC  4.42  3.87 - 5.11 MIL/uL   Hemoglobin 13.3  12.0 - 15.0 g/dL   HCT 16.1  09.6 - 04.5 %   MCV 89.8  78.0 - 100.0 fL   MCH 30.1  26.0 - 34.0 pg   MCHC 33.5  30.0 - 36.0 g/dL   RDW 40.9  81.1 - 91.4 %   Platelets 184  150 - 400 K/uL   Neutrophils Relative 55  43 - 77 %   Neutro Abs 2.9  1.7 - 7.7 K/uL   Lymphocytes Relative 33  12 - 46 %   Lymphs Abs 1.8  0.7 - 4.0 K/uL   Monocytes Relative 7  3 - 12 %   Monocytes Absolute 0.4  0.1 - 1.0 K/uL   Eosinophils Relative 5  0 - 5 %   Eosinophils Absolute 0.2  0.0 - 0.7 K/uL   Basophils Relative 0  0 - 1 %   Basophils Absolute 0.0  0.0 - 0.1 K/uL  WET PREP, GENITAL     Status: Abnormal   Collection Time    07/28/12  2:33 PM      Result Value Range   Yeast Wet Prep HPF POC NONE SEEN  NONE SEEN   Trich, Wet Prep NONE SEEN  NONE SEEN   Clue Cells Wet Prep HPF POC NONE SEEN  NONE SEEN   WBC, Wet Prep HPF POC FEW (*) NONE SEEN   MAU Course  Procedures GC/CHL to labs  MDM  Ibuprofen 400mg  po ordered for cramping   Assessment and Plan  A:  Nausea, vomiting and diarrhea - gastroenteritis      Abdominal cramping     Negative pregnancy test  P:  Brat diet     Important to stay well hydrated     May take Ibuprofen prn for cramping  KEY,EVE M 07/28/2012, 4:16 PM

## 2012-07-28 NOTE — MAU Note (Signed)
Patient states she has been having abdominal pain for 4 days. Had Depo injection in January. Slight discharge, no bleeding. Had one episode of nausea, vomiting and soft stool this am. No nausea at this time.

## 2012-07-28 NOTE — MAU Note (Signed)
abd cramping past four days, feels like cramping with cycle. No bleeding, small amt of discharge yesterday- blood streaked.  Last Depo was in January.

## 2012-07-29 LAB — GC/CHLAMYDIA PROBE AMP
CT Probe RNA: NEGATIVE
GC Probe RNA: NEGATIVE

## 2012-08-02 NOTE — MAU Provider Note (Signed)
Pt called for GC/Chlamdyia results- reported to pt by phone- negative/negative Pamelia Hoit, RNC/WHNP

## 2012-10-31 ENCOUNTER — Emergency Department (HOSPITAL_COMMUNITY)
Admission: EM | Admit: 2012-10-31 | Discharge: 2012-10-31 | Discharge status: Against Medical Advice | Payer: No Typology Code available for payment source | Attending: Emergency Medicine | Admitting: Emergency Medicine

## 2012-10-31 ENCOUNTER — Encounter (HOSPITAL_COMMUNITY): Payer: Self-pay | Admitting: Emergency Medicine

## 2012-10-31 DIAGNOSIS — Y9389 Activity, other specified: Secondary | ICD-10-CM | POA: Insufficient documentation

## 2012-10-31 DIAGNOSIS — S0510XA Contusion of eyeball and orbital tissues, unspecified eye, initial encounter: Secondary | ICD-10-CM | POA: Insufficient documentation

## 2012-10-31 DIAGNOSIS — Y92410 Unspecified street and highway as the place of occurrence of the external cause: Secondary | ICD-10-CM | POA: Insufficient documentation

## 2012-10-31 DIAGNOSIS — J45909 Unspecified asthma, uncomplicated: Secondary | ICD-10-CM | POA: Insufficient documentation

## 2012-10-31 DIAGNOSIS — S3981XA Other specified injuries of abdomen, initial encounter: Secondary | ICD-10-CM | POA: Insufficient documentation

## 2012-10-31 DIAGNOSIS — R110 Nausea: Secondary | ICD-10-CM | POA: Insufficient documentation

## 2012-10-31 DIAGNOSIS — Z3202 Encounter for pregnancy test, result negative: Secondary | ICD-10-CM | POA: Insufficient documentation

## 2012-10-31 LAB — URINALYSIS, ROUTINE W REFLEX MICROSCOPIC
Bilirubin Urine: NEGATIVE
Hgb urine dipstick: NEGATIVE
Ketones, ur: NEGATIVE mg/dL
Nitrite: NEGATIVE
Specific Gravity, Urine: 1.028 (ref 1.005–1.030)
Urobilinogen, UA: 1 mg/dL (ref 0.0–1.0)
pH: 5.5 (ref 5.0–8.0)
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
## 2012-10-31 NOTE — ED Notes (Signed)
Pt called multiple times for lab draw with no answer

## 2012-10-31 NOTE — ED Notes (Signed)
Pt called to be taken back to room. No response.

## 2013-01-07 ENCOUNTER — Emergency Department (HOSPITAL_COMMUNITY)
Admission: EM | Admit: 2013-01-07 | Discharge: 2013-01-07 | Disposition: A | Discharge status: Home / Self Care | Payer: Medicaid Other | Attending: Emergency Medicine | Admitting: Emergency Medicine

## 2013-01-07 ENCOUNTER — Encounter (HOSPITAL_COMMUNITY): Payer: Self-pay | Admitting: Emergency Medicine

## 2013-01-07 DIAGNOSIS — Y9389 Activity, other specified: Secondary | ICD-10-CM | POA: Insufficient documentation

## 2013-01-07 DIAGNOSIS — Z23 Encounter for immunization: Secondary | ICD-10-CM | POA: Insufficient documentation

## 2013-01-07 DIAGNOSIS — Z792 Long term (current) use of antibiotics: Secondary | ICD-10-CM | POA: Insufficient documentation

## 2013-01-07 DIAGNOSIS — J45909 Unspecified asthma, uncomplicated: Secondary | ICD-10-CM | POA: Insufficient documentation

## 2013-01-07 DIAGNOSIS — S96909A Unspecified injury of unspecified muscle and tendon at ankle and foot level, unspecified foot, initial encounter: Secondary | ICD-10-CM | POA: Insufficient documentation

## 2013-01-07 DIAGNOSIS — S91309A Unspecified open wound, unspecified foot, initial encounter: Secondary | ICD-10-CM | POA: Insufficient documentation

## 2013-01-07 DIAGNOSIS — Z791 Long term (current) use of non-steroidal anti-inflammatories (NSAID): Secondary | ICD-10-CM | POA: Insufficient documentation

## 2013-01-07 DIAGNOSIS — W268XXA Contact with other sharp object(s), not elsewhere classified, initial encounter: Secondary | ICD-10-CM | POA: Insufficient documentation

## 2013-01-07 DIAGNOSIS — S91312A Laceration without foreign body, left foot, initial encounter: Secondary | ICD-10-CM

## 2013-01-07 DIAGNOSIS — Y929 Unspecified place or not applicable: Secondary | ICD-10-CM | POA: Insufficient documentation

## 2013-01-07 MED ORDER — IBUPROFEN 800 MG PO TABS: 800.0000 mg | ORAL_TABLET | Freq: Three times a day (TID) | ORAL | Status: DC

## 2013-01-07 MED ORDER — IBUPROFEN 800 MG PO TABS
800.0000 mg | ORAL_TABLET | Freq: Once | ORAL | Status: AC
  Administered 2013-01-07: 800 mg via ORAL
  Filled 2013-01-07: qty 1

## 2013-01-07 MED ORDER — TETANUS-DIPHTH-ACELL PERTUSSIS 5-2.5-18.5 LF-MCG/0.5 IM SUSP
0.5000 mL | Freq: Once | INTRAMUSCULAR | Status: AC
  Administered 2013-01-07: 0.5 mL via INTRAMUSCULAR
  Filled 2013-01-07: qty 0.5

## 2013-01-07 MED ORDER — CIPROFLOXACIN HCL 500 MG PO TABS: 500.0000 mg | ORAL_TABLET | Freq: Two times a day (BID) | ORAL | Status: DC

## 2013-01-07 MED ORDER — HYDROCODONE-ACETAMINOPHEN 5-325 MG PO TABS: ORAL_TABLET | ORAL | Status: DC

## 2013-01-07 NOTE — ED Provider Notes (Signed)
CSN: [id number]     Arrival date & time 01/07/13  0348 History     First MD Initiated Contact with Patient 01/07/13 0425     Chief Complaint  Patient presents with  . Laceration   (Consider location/radiation/quality/duration/timing/severity/associated sxs/prior Treatment) HPI Pt is a 32yo female c/o left foot and toe pain after cutting the bottom of her foot on broken glass bottom earlier this morning while taking out the trash at a party.  Pt states there was moderate bleeding but was able to control PTA.  Pain is constant, sharp, aching pain, 3/10 at this time, worse with ambulation.  Tetanus was given 45yrs ago.  Pt otherwise healthy. No other injuries.   Past Medical History  Diagnosis Date  . Asthma    Past Surgical History  Procedure Laterality Date  . Cesarean section    . Tubal ligation     Family History  Problem Relation Age of Onset  . Diabetes Mother   . Hypertension Mother   . Hypertension Sister   . Stroke Maternal Grandmother   . Hypertension Maternal Grandmother   . Diabetes Maternal Grandmother   . Diabetes Maternal Grandfather    History  Substance Use Topics  . Smoking status: Never Smoker   . Smokeless tobacco: Never Used  . Alcohol Use: Yes     Comment: occaisional- not daily   OB History   Grav Para Term Preterm Abortions TAB SAB Ect Mult Living   3 3 3       3      Review of Systems  Musculoskeletal: Negative for joint swelling and arthralgias.  Skin: Positive for wound.       Left foot  All other systems reviewed and are negative.    Allergies  Review of patient's allergies indicates no known allergies.  Home Medications   Current Outpatient Rx  Name  Route  Sig  Dispense  Refill  . medroxyPROGESTERone (DEPO-PROVERA) 150 MG/ML injection   Intramuscular   Inject 150 mg into the muscle every 3 (three) months.         . ciprofloxacin (CIPRO) 500 MG tablet   Oral   Take 1 tablet (500 mg total) by mouth 2 (two) times daily.   14  tablet   0   . HYDROcodone-acetaminophen (NORCO/VICODIN) 5-325 MG per tablet      Take 1-2 pills every 4-6 hours as needed for pain.   10 tablet   0   . ibuprofen (ADVIL,MOTRIN) 800 MG tablet   Oral   Take 1 tablet (800 mg total) by mouth 3 (three) times daily.   21 tablet   0    BP 116/82  Pulse 81  Temp(Src) 98.3 F (36.8 C) (Oral)  Resp 18  SpO2 99%  LMP 11/07/2012 Physical Exam  Nursing note and vitals reviewed. Constitutional: She appears well-developed and well-nourished. No distress.  HENT:  Head: Normocephalic and atraumatic.  Eyes: Conjunctivae and EOM are normal. No scleral icterus.  Neck: Normal range of motion.  Cardiovascular: Normal rate.   Pulmonary/Chest: Effort normal.  Musculoskeletal: Normal range of motion. She exhibits tenderness.       Feet:  4cm lac on plantar aspect of left foot involving plantar/lateral aspect of 4th toe.  No edema or deformity. Not actively bleeding.  No foreign bodies seen.   Neurological: She is alert.  Skin: Skin is warm and dry. She is not diaphoretic.  Psychiatric: She has a normal mood and affect. Her behavior is normal.  ED Course   Procedures  LACERATION REPAIR Performed by: Junius Finner A. Authorized by: Ina Homes Consent: Verbal consent obtained. Risks and benefits: risks, benefits and alternatives were discussed Consent given by: patient Patient identity confirmed: provided demographic data Prepped and Draped in normal sterile fashion Wound explored  Laceration Location: plantar aspect left foot involving plantar/lacteral aspect of 4th toe  Laceration Length: 4cm  No Foreign Bodies seen or palpated  Anesthesia: local infiltration  Local anesthetic: lidocaine 1% without epinephrine  Anesthetic total: 7ml  Irrigation method: syringe Amount of cleaning: standard  Skin closure: complex  Number of sutures: 4  Technique: interrupted  Patient tolerance: Patient tolerated the procedure  well with no immediate complications.   Labs Reviewed - No data to display No results found. 1. Foot laceration involving tendon, left, initial encounter     MDM  Dr. Read Drivers saw pt in triage, no imaging needed at this time. Tx: tetanus.  Will suture laceration.  See procedure note.  Rx: norco, ibuprofen, ciprofloxacin. Will discharge pt home and have her f/u with [facility], previously established PCP or return to ER for suture removal in 8-10 days. Return precautions given. Pt verbalized understanding and agreement with tx plan. Vitals: unremarkable. Discharged in stable condition.    Discussed pt with attending during ED encounter.   Junius Finner, PA-C 01/07/13 [phone]

## 2013-01-07 NOTE — ED Notes (Signed)
PA at bedside.

## 2013-01-07 NOTE — ED Provider Notes (Signed)
Medical screening examination/treatment/procedure(s) were conducted as a shared visit with non-physician practitioner(s) and myself.  I personally evaluated the patient during the encounter  Laceration to the sole of left foot involving the distal sole and fourth toe.  Hanley Seamen, MD 01/07/13 [phone]

## 2013-01-07 NOTE — ED Notes (Signed)
Pt states while attempting to take out trash a party she stepped on broken bottle. Laceration noted to L foot between 4th and 5th toe. Bleeding controlled.

## 2013-01-18 ENCOUNTER — Ambulatory Visit: Payer: Self-pay | Admitting: Obstetrics & Gynecology

## 2013-01-23 ENCOUNTER — Ambulatory Visit: Payer: Self-pay | Admitting: Obstetrics & Gynecology

## 2013-02-01 ENCOUNTER — Encounter: Payer: Self-pay | Admitting: Obstetrics & Gynecology

## 2013-02-01 ENCOUNTER — Other Ambulatory Visit: Payer: Self-pay

## 2013-02-01 ENCOUNTER — Ambulatory Visit (INDEPENDENT_AMBULATORY_CARE_PROVIDER_SITE_OTHER): Payer: Medicaid Other | Admitting: Obstetrics & Gynecology

## 2013-02-01 VITALS — BP 124/81 | HR 93 | Temp 98.8°F | Ht 65.0 in | Wt 138.0 lb

## 2013-02-01 DIAGNOSIS — N939 Abnormal uterine and vaginal bleeding, unspecified: Secondary | ICD-10-CM

## 2013-02-01 DIAGNOSIS — N926 Irregular menstruation, unspecified: Secondary | ICD-10-CM

## 2013-02-01 DIAGNOSIS — Z113 Encounter for screening for infections with a predominantly sexual mode of transmission: Secondary | ICD-10-CM

## 2013-02-01 LAB — POCT URINE PREGNANCY: Preg Test, Ur: NEGATIVE

## 2013-02-01 NOTE — Progress Notes (Signed)
Subjective:     [name] [name] is a 32 y.o. female here for a routine exam.  Current complaints: Patient is in the office for annual exam. Patient reports she has started bleeding all the time with the Depo and is interested in an ablation procedure..  Personal health questionnaire reviewed: no.   Gynecologic History Patient's last menstrual period was 01/30/2013. Contraception: Depo-Provera injections and tubal ligation Last Pap: 1 year. Results were: normal   Obstetric History OB History  Gravida Para Term Preterm AB SAB TAB Ectopic Multiple Living  3 3 3       3     # Outcome Date GA Lbr Len/2nd Weight Sex Delivery Anes PTL Lv  3 TRM     M LTCS  N Y  2 TRM     F LTCS  N Y  1 TRM     M LTCS   Y     Comments: unable to deliver- "shoulders wouldn't pass"       The following portions of the patient's history were reviewed and updated as appropriate: allergies, current medications, past family history, past medical history, past social history, past surgical history and problem list.  Review of Systems Pertinent items are noted in HPI.    Objective:   General:  alert     Abdomen: soft, non-tender; bowel sounds normal; no masses,  no organomegaly   Vulva:  normal  Vagina: normal vagina  Cervix:  no lesions  Corpus: normal size, contour, position, consistency, mobility, non-tender  Adnexa:  normal adnexa    Assessment:    AUB  Plan:    A Novasure endometrial ablation is planned; risks/benefits reviewed

## 2013-02-02 LAB — GC/CHLAMYDIA PROBE AMP
CT Probe RNA: NEGATIVE
GC Probe RNA: NEGATIVE

## 2013-02-15 ENCOUNTER — Ambulatory Visit (INDEPENDENT_AMBULATORY_CARE_PROVIDER_SITE_OTHER): Payer: Medicaid Other | Admitting: Obstetrics & Gynecology

## 2013-02-15 ENCOUNTER — Ambulatory Visit (INDEPENDENT_AMBULATORY_CARE_PROVIDER_SITE_OTHER): Payer: Medicaid Other

## 2013-02-15 ENCOUNTER — Encounter: Payer: Self-pay | Admitting: Obstetrics & Gynecology

## 2013-02-15 VITALS — Temp 98.0°F | Ht 65.0 in | Wt 141.2 lb

## 2013-02-15 DIAGNOSIS — N939 Abnormal uterine and vaginal bleeding, unspecified: Secondary | ICD-10-CM

## 2013-02-15 DIAGNOSIS — N926 Irregular menstruation, unspecified: Secondary | ICD-10-CM

## 2013-02-15 DIAGNOSIS — N839 Noninflammatory disorder of ovary, fallopian tube and broad ligament, unspecified: Secondary | ICD-10-CM

## 2013-02-15 MED ORDER — METRONIDAZOLE 500 MG PO TABS: 500.0000 mg | ORAL_TABLET | Freq: Two times a day (BID) | ORAL | Status: DC

## 2013-02-15 NOTE — Progress Notes (Signed)
Subjective:     [name] [name] is a 33 y.o. female here for her ultrasound results.  She would like to schedule her NovaSure.  No current complaints.  Personal health questionnaire reviewed: no.   Gynecologic History Patient's last menstrual period was 01/30/2013. Contraception: tubal ligation and depo injection Last Pap: 08/2011. Results were: abnormal Last mammogram: N/A.  Obstetric History OB History  Gravida Para Term Preterm AB SAB TAB Ectopic Multiple Living  3 3 3       3     # Outcome Date GA Lbr Len/2nd Weight Sex Delivery Anes PTL Lv  3 TRM     M LTCS  N Y  2 TRM     F LTCS  N Y  1 TRM     M LTCS   Y     Comments: unable to deliver- "shoulders wouldn't pass"       The following portions of the patient's history were reviewed and updated as appropriate: allergies, current medications, past family history, past medical history, past social history, past surgical history and problem list.  Review of Systems Pertinent items are noted in HPI.    Objective:     No exam today     Assessment:   AUB  Plan:   A Novasure endometrial ablation is planned--the risks/benefits were reviewed

## 2013-02-16 ENCOUNTER — Encounter: Payer: Self-pay | Admitting: Obstetrics & Gynecology

## 2013-02-16 NOTE — Patient Instructions (Signed)
Endometrial Ablation Endometrial ablation removes the lining of the uterus (endometrium). It is usually a same day, outpatient treatment. Ablation helps avoid major surgery (such as a hysterectomy). A hysterectomy is removal of the cervix and uterus. Endometrial ablation has less risk and complications, has a shorter recovery period and is less expensive. After endometrial ablation, most women will have little or no menstrual bleeding. You may not keep your fertility. Pregnancy is no longer likely after this procedure but if you are pre-menopausal, you still need to use a reliable method of birth control following the procedure because pregnancy can occur. REASONS TO HAVE THE PROCEDURE MAY INCLUDE:  Heavy periods.  Bleeding that is causing anemia.  Anovulatory bleeding, very irregular, bleeding.  Bleeding submucous fibroids (on the lining inside the uterus) if they are smaller than 3 centimeters. REASONS NOT TO HAVE THE PROCEDURE MAY INCLUDE:  You wish to have more children.  You have a pre-cancerous or cancerous problem. The cause of any abnormal bleeding must be diagnosed before having the procedure.  You have pain coming from the uterus.  You have a submucus fibroid larger than 3 centimeters.  You recently had a baby.  You recently had an infection in the uterus.  You have a severe retro-flexed, tipped uterus and cannot insert the instrument to do the ablation.  You had a Cesarean section or deep major surgery on the uterus.  The inner cavity of the uterus is too large for the endometrial ablation instrument. RISKS AND COMPLICATIONS   Perforation of the uterus.  Bleeding.  Infection of the uterus, bladder or vagina.  Injury to surrounding organs.  Cutting the cervix.  An air bubble to the lung (air embolus).  Pregnancy following the procedure.  Failure of the procedure to help the problem requiring hysterectomy.  Decreased ability to diagnose cancer in the lining of  the uterus. BEFORE THE PROCEDURE  The lining of the uterus must be tested to make sure there is no pre-cancerous or cancer cells present.  Medications may be given to make the lining of the uterus thinner.  Ultrasound may be used to evaluate the size and look for abnormalities of the uterus.  Future pregnancy is not desired. PROCEDURE  There are different ways to destroy the lining of the uterus.   Resectoscope - radio frequency-alternating electric current is the most common one used.  Cryotherapy - freezing the lining of the uterus.  Heated Free Liquid - heated salt (saline) solution inserted into the uterus.  Microwave - uses high energy microwaves in the uterus.  Thermal Balloon - a catheter with a balloon tip is inserted into the uterus and filled with heated fluid. Your caregiver will talk with you about the method used in this clinic. They will also instruct you on the pros and cons of the procedure. Endometrial ablation is performed along with a procedure called operative hysteroscopy. A narrow viewing tube is inserted through the birth canal (vagina) and through the cervix into the uterus. A tiny camera attached to the viewing tube (hysteroscope) allows the uterine cavity to be shown on a TV monitor during surgery. Your uterus is filled with a harmless liquid to make the procedure easier. The lining of the uterus is then removed. The lining can also be removed with a resectoscope which allows your surgeon to cut away the lining of the uterus under direct vision. Usually, you will be able to go home within an hour after the procedure. HOME CARE INSTRUCTIONS   Do   not drive for 24 hours.  No tampons, douching or intercourse for 2 weeks or until your caregiver approves.  Rest at home for 24 to 48 hours. You may then resume normal activities unless told differently by your caregiver.  Take your temperature two times a day for 4 days, and record it.  Take any medications your  caregiver has ordered, as directed.  Use some form of contraception if you are pre-menopausal and do not want to get pregnant. Bleeding after the procedure is normal. It varies from light spotting and mildly watery to bloody discharge for 4 to 6 weeks. You may also have mild cramping. Only take over-the-counter or prescription medicines for pain, discomfort, or fever as directed by your caregiver. Do not use aspirin, as this may aggravate bleeding. Frequent urination during the first 24 hours is normal. You will not know how effective your surgery is until at least 3 months after the surgery. SEEK IMMEDIATE MEDICAL CARE IF:   Bleeding is heavier than a normal menstrual cycle.  An oral temperature above 102 F (38.9 C) develops.  You have increasing cramps or pains not relieved with medication or develop belly (abdominal) pain which does not seem to be related to the same area of earlier cramping and pain.  You are light headed, weak or have fainting episodes.  You develop pain in the shoulder strap areas.  You have chest or leg pain.  You have abnormal vaginal discharge.  You have painful urination. Document Released: 03/27/2004 Document Revised: 08/10/2011 Document Reviewed: 06/25/2007 [employer] Patient Information 2014 [employer].  

## 2013-03-01 ENCOUNTER — Other Ambulatory Visit: Payer: Self-pay | Admitting: *Deleted

## 2013-03-02 ENCOUNTER — Encounter: Payer: Self-pay | Admitting: Obstetrics & Gynecology

## 2013-03-02 ENCOUNTER — Encounter (HOSPITAL_COMMUNITY): Payer: Self-pay | Admitting: Obstetrics & Gynecology

## 2013-03-09 ENCOUNTER — Inpatient Hospital Stay (HOSPITAL_COMMUNITY)
Admission: AD | Admit: 2013-03-09 | Discharge: 2013-03-09 | Disposition: A | Discharge status: Home / Self Care | Payer: Medicaid Other | Source: Ambulatory Visit | Attending: Obstetrics | Admitting: Obstetrics

## 2013-03-09 ENCOUNTER — Encounter (HOSPITAL_COMMUNITY): Payer: Self-pay | Admitting: *Deleted

## 2013-03-09 DIAGNOSIS — A499 Bacterial infection, unspecified: Secondary | ICD-10-CM | POA: Insufficient documentation

## 2013-03-09 DIAGNOSIS — N949 Unspecified condition associated with female genital organs and menstrual cycle: Secondary | ICD-10-CM | POA: Insufficient documentation

## 2013-03-09 DIAGNOSIS — N72 Inflammatory disease of cervix uteri: Secondary | ICD-10-CM | POA: Insufficient documentation

## 2013-03-09 DIAGNOSIS — B9689 Other specified bacterial agents as the cause of diseases classified elsewhere: Secondary | ICD-10-CM | POA: Insufficient documentation

## 2013-03-09 DIAGNOSIS — N760 Acute vaginitis: Secondary | ICD-10-CM | POA: Insufficient documentation

## 2013-03-09 DIAGNOSIS — N765 Ulceration of vagina: Secondary | ICD-10-CM

## 2013-03-09 LAB — WET PREP, GENITAL
Trich, Wet Prep: NONE SEEN
Yeast Wet Prep HPF POC: NONE SEEN

## 2013-03-09 LAB — POCT PREGNANCY, URINE: Preg Test, Ur: NEGATIVE

## 2013-03-09 MED ORDER — VALACYCLOVIR HCL 1 G PO TABS: 1000.0000 mg | ORAL_TABLET | Freq: Two times a day (BID) | ORAL | Status: DC

## 2013-03-09 NOTE — MAU Note (Signed)
Was on cycle for 4 wks, saw on MD, had biopsy and blood work, is scheduled for ablation on 10/31.  Now having vag pain and irritation, burning. Started about 5 days ago, currently on meds for BV

## 2013-03-09 NOTE — MAU Provider Note (Signed)
History     CSN: [id number]  Arrival date and time: 03/09/13 1308   First Provider Initiated Contact with Patient 03/09/13 1431      Chief Complaint  Patient presents with  . Vaginal Pain  . Vaginal Discharge   HPI [name] [name] is 32 y.o. [phone] presents with vaginal irritation, rash X 4 days.  Used vaseline but not helpful.  She is a patient of Dr. Verdell Carmine.  She called the office and they directed her here for her evaluation.  She is currently being treated for Bacterial Vaginosis with Flagyl--day 3 of treatment.  Denies history of HSV.  1 partner X 11 year.   Denies flu-like sxs.  She has endometrial ablation scheduled 10/31.     Past Medical History  Diagnosis Date  . Asthma     childhood - no problems as adult, no inhaler  . Seasonal allergies   . History of blood transfusion 2005    [facility] with c/s surgery    Past Surgical History  Procedure Laterality Date  . Cesarean section      x 3  . Tubal ligation    . Wisdom tooth extraction      Family History  Problem Relation Age of Onset  . Diabetes Mother   . Hypertension Mother   . Hypertension Sister   . Stroke Maternal Grandmother   . Hypertension Maternal Grandmother   . Diabetes Maternal Grandmother   . Diabetes Maternal Grandfather     History  Substance Use Topics  . Smoking status: Never Smoker   . Smokeless tobacco: Never Used  . Alcohol Use: Yes     Comment: socially    Allergies: No Known Allergies  Prescriptions prior to admission  Medication Sig Dispense Refill  . metroNIDAZOLE (FLAGYL) 500 MG tablet Take 500 mg by mouth 2 times daily at 12 noon and 4 pm. X 7 days        Review of Systems  Constitutional: Negative for fever and chills.  Gastrointestinal: Negative for nausea, vomiting and abdominal pain.  Genitourinary: Negative for dysuria, urgency, frequency and hematuria.       + for vaginal irritation/pain  Neurological: Negative for headaches.   Physical Exam   Blood pressure  112/81, pulse 80, temperature 98.6 F (37 C), temperature source Oral, resp. rate 18, height 5\' 4"  (1.626 m), weight 140 lb (63.504 kg), last menstrual period 02/07/2013.  Physical Exam  Constitutional: She is oriented to person, place, and time. She appears well-developed and well-nourished. No distress.  HENT:  Head: Normocephalic.  Neck: Normal range of motion.  Cardiovascular: Normal rate.   Respiratory: Effort normal.  GI: Soft. She exhibits no distension and no mass. There is no tenderness. There is no rebound and no guarding.  Genitourinary:    There is tenderness and lesion on the right labia. There is no rash or injury on the right labia. There is tenderness and lesion on the left labia. There is no rash or injury on the left labia. There is tenderness (at introitus) around the vagina. No erythema or bleeding around the vagina. No signs of injury around the vagina. No vaginal discharge found.  Two circular, indurated open "kissing" lesions at the bottom of the labia bilaterally.  Small amount of discharge in the center.  Red raised edges.  Tender   Lymphadenopathy:       Right: No inguinal adenopathy present.       Left: No inguinal adenopathy present.  Neurological: She is  alert and oriented to person, place, and time.  Skin: Skin is warm and dry.  Psychiatric: She has a normal mood and affect. Her behavior is normal.   Results for orders placed during the hospital encounter of 03/09/13 (from the past 24 hour(s))  POCT PREGNANCY, URINE     Status: None   Collection Time    03/09/13  1:46 PM      Result Value Range   Preg Test, Ur NEGATIVE  NEGATIVE  WET PREP, GENITAL     Status: Abnormal   Collection Time    03/09/13  2:45 PM      Result Value Range   Yeast Wet Prep HPF POC NONE SEEN  NONE SEEN   Trich, Wet Prep NONE SEEN  NONE SEEN   Clue Cells Wet Prep HPF POC MODERATE (*) NONE SEEN   WBC, Wet Prep HPF POC RARE (*) NONE SEEN    MAU Course  Procedures  HSV culture  pending  MDM   Assessment and Plan  A:  Vaginal ulcerations suspicious for HSV      Current Bacterial infection on day 3 of Flagyl  P:  Rx for Valtrex 1gm bid X 10 day      Continue Flagyl until completed      Patient instructed to call office for Culture results in 1 week      Pelvic rest until culture back and lesions resolved.      Follow up with Dr. Alvia Grove M 03/09/2013, 3:31 PM

## 2013-03-13 ENCOUNTER — Emergency Department (HOSPITAL_COMMUNITY)
Admission: EM | Admit: 2013-03-13 | Discharge: 2013-03-13 | Disposition: A | Discharge status: Home / Self Care | Payer: Medicaid Other | Attending: Emergency Medicine | Admitting: Emergency Medicine

## 2013-03-13 ENCOUNTER — Encounter: Payer: Self-pay | Admitting: Obstetrics

## 2013-03-13 ENCOUNTER — Encounter (HOSPITAL_COMMUNITY): Payer: Self-pay | Admitting: Emergency Medicine

## 2013-03-13 DIAGNOSIS — Z79899 Other long term (current) drug therapy: Secondary | ICD-10-CM | POA: Insufficient documentation

## 2013-03-13 DIAGNOSIS — G44209 Tension-type headache, unspecified, not intractable: Secondary | ICD-10-CM | POA: Insufficient documentation

## 2013-03-13 DIAGNOSIS — J45909 Unspecified asthma, uncomplicated: Secondary | ICD-10-CM | POA: Insufficient documentation

## 2013-03-13 DIAGNOSIS — H53149 Visual discomfort, unspecified: Secondary | ICD-10-CM | POA: Insufficient documentation

## 2013-03-13 DIAGNOSIS — R112 Nausea with vomiting, unspecified: Secondary | ICD-10-CM | POA: Insufficient documentation

## 2013-03-13 LAB — HERPES SIMPLEX VIRUS CULTURE: Culture: DETECTED

## 2013-03-13 MED ORDER — HYDROCODONE-ACETAMINOPHEN 5-325 MG PO TABS: 1.0000 | ORAL_TABLET | ORAL | Status: DC | PRN

## 2013-03-13 MED ORDER — DIPHENHYDRAMINE HCL 50 MG/ML IJ SOLN
12.5000 mg | Freq: Once | INTRAMUSCULAR | Status: AC
  Administered 2013-03-13: 12.5 mg via INTRAVENOUS
  Filled 2013-03-13: qty 1

## 2013-03-13 MED ORDER — IBUPROFEN 600 MG PO TABS: 600.0000 mg | ORAL_TABLET | Freq: Four times a day (QID) | ORAL | Status: DC | PRN

## 2013-03-13 MED ORDER — KETOROLAC TROMETHAMINE 30 MG/ML IJ SOLN
30.0000 mg | Freq: Once | INTRAMUSCULAR | Status: AC
  Administered 2013-03-13: 30 mg via INTRAVENOUS
  Filled 2013-03-13: qty 1

## 2013-03-13 MED ORDER — METOCLOPRAMIDE HCL 5 MG/ML IJ SOLN
10.0000 mg | Freq: Once | INTRAMUSCULAR | Status: AC
  Administered 2013-03-13: 10 mg via INTRAVENOUS
  Filled 2013-03-13: qty 2

## 2013-03-13 MED ORDER — SODIUM CHLORIDE 0.9 % IV BOLUS (SEPSIS)
1000.0000 mL | Freq: Once | INTRAVENOUS | Status: AC
  Administered 2013-03-13: 1000 mL via INTRAVENOUS

## 2013-03-13 NOTE — ED Notes (Signed)
Headache since yesterday with n v.  lmp sept 3

## 2013-03-13 NOTE — ED Provider Notes (Signed)
CSN: [id number]     Arrival date & time 03/13/13  0258 History   First MD Initiated Contact with Patient 03/13/13 0314     Chief Complaint  Patient presents with  . Headache   (Consider location/radiation/quality/duration/timing/severity/associated sxs/prior Treatment) HPI Patient is a generally healthy 32 yo woman who presents with bilateral frontal and occipital headache pain. Patient says pain is present in a band like distribution. Sx began while she was at work around 11am yesterday. She is a Social research officer, government.    Patient describes her pain as severe and aching. She has photophobia. The patient has been nauseated and says she vomited after eating some soup this evening. She denies any excacerbating factors with the exception of light exposure. She took Advil Migraine earlier in the evening but, experienced no benefit.   Denies fever. Denies any focal neurologic deficits. No recent international travel. Pt notes she had some nasal congestion earlier in the week but, this has resolved. NO ST or other ENT sx.   Past Medical History  Diagnosis Date  . Asthma     childhood - no problems as adult, no inhaler  . Seasonal allergies   . History of blood transfusion 2005    [facility] with c/s surgery   Past Surgical History  Procedure Laterality Date  . Cesarean section      x 3  . Tubal ligation    . Wisdom tooth extraction     Family History  Problem Relation Age of Onset  . Diabetes Mother   . Hypertension Mother   . Hypertension Sister   . Stroke Maternal Grandmother   . Hypertension Maternal Grandmother   . Diabetes Maternal Grandmother   . Diabetes Maternal Grandfather    History  Substance Use Topics  . Smoking status: Never Smoker   . Smokeless tobacco: Never Used  . Alcohol Use: Yes     Comment: socially   OB History   Grav Para Term Preterm Abortions TAB SAB Ect Mult Living   3 3 3       3      Review of Systems Gen: no weight loss, fevers, chills, night  sweats Eyes: no discharge or drainage, no occular pain or visual changes Nose: no epistaxis or rhinorrhea Mouth: no dental pain, no sore throat Neck: no neck pain Lungs: no SOB, cough, wheezing CV: no chest pain, palpitations, dependent edema or orthopnea Abd: As per history of present illness, otherwise negative GU: no dysuria or gross hematuria MSK: no myalgias or arthralgias Neuro: As per history of present illness, otherwise negative Skin: no rash Psyche: negative.  Allergies  Review of patient's allergies indicates no known allergies.  Home Medications   Current Outpatient Rx  Name  Route  Sig  Dispense  Refill  . Ibuprofen (ADVIL MIGRAINE PO)   Oral   Take 2 tablets by mouth every 8 (eight) hours as needed (for headache).         . metroNIDAZOLE (FLAGYL) 500 MG tablet   Oral   Take 500 mg by mouth 2 times daily at 12 noon and 4 pm. X 7 days         . valACYclovir (VALTREX) 1000 MG tablet   Oral   Take 1 tablet (1,000 mg total) by mouth 2 (two) times daily.   14 tablet   0    BP 119/70  Pulse 79  Temp(Src) 98.9 F (37.2 C) (Oral)  Resp 16  SpO2 100%  LMP 02/07/2013 Physical Exam Gen:  well developed and well nourished appearing Head: NCAT Eyes: PERL, EOMI, normal nondilated funduscopic exam, optic disc margins are crisp. Nose: no epistaixis or rhinorrhea Mouth/throat: mucosa is moist and pink Neck: supple, no stridor, ttp over the trapezius muscles at the body of the muscle and origin at base of scalp, ttp over scalene musculature, palpation of these muscle modifies headache pain.  Lungs: CTA B, no wheezing, rhonchi or rales CV: RRR, no murmur Abd: soft, notender Back: normal to inspection Skin: no rashese, wnl Neuro: CN ii-xii grossly intact, no focal deficits, 5/5 motor strength both arms and legs, normal gait, normal speech Psyche; flat affect,  calm and cooperative.   ED Course  Procedures (including critical care time)   MDM   Patient with  suspected tension headache. Normal neuro exam. Afebrile with normal VS. Do no suspect SAH or meningitis. We are managing symptomatically with IV NS, 12.5mg  benadryl, reglan and Toradol and will reassess. Anticipate discharge home with supportive care.    Brandt Loosen, MD 03/13/13 [phone]

## 2013-03-13 NOTE — ED Notes (Signed)
Pt informed that she needs someone to come pick her up from ED that she is unable to drive due to med administration.

## 2013-03-14 ENCOUNTER — Encounter: Payer: Self-pay | Admitting: Obstetrics

## 2013-03-14 ENCOUNTER — Telehealth (HOSPITAL_COMMUNITY): Payer: Self-pay | Admitting: Obstetrics and Gynecology

## 2013-03-14 NOTE — Telephone Encounter (Signed)
[name] [name] called to obtain her HSV culture results. Confirmed her DOB and shared her results over the phone. Pt was given Valtrex and plans to fill the medication. All questions answered.

## 2013-03-15 ENCOUNTER — Encounter (HOSPITAL_COMMUNITY): Payer: Self-pay | Admitting: Pharmacist

## 2013-03-22 ENCOUNTER — Other Ambulatory Visit: Payer: Self-pay | Admitting: *Deleted

## 2013-03-22 DIAGNOSIS — A6009 Herpesviral infection of other urogenital tract: Secondary | ICD-10-CM

## 2013-03-22 MED ORDER — VALACYCLOVIR HCL 500 MG PO TABS: 500.0000 mg | ORAL_TABLET | Freq: Every day | ORAL | Status: DC

## 2013-03-22 NOTE — Telephone Encounter (Signed)
Patient states she was recently diagnosed with genital herpes at the ER. Patient is requesting suppressive therapy. Ok per Dr Tamela Oddi.

## 2013-03-31 ENCOUNTER — Encounter (HOSPITAL_COMMUNITY): Payer: Self-pay

## 2013-03-31 ENCOUNTER — Encounter (HOSPITAL_COMMUNITY): Payer: Medicaid Other

## 2013-03-31 ENCOUNTER — Ambulatory Visit (HOSPITAL_COMMUNITY): Payer: Medicaid Other

## 2013-03-31 ENCOUNTER — Ambulatory Visit (HOSPITAL_COMMUNITY)
Admission: RE | Admit: 2013-03-31 | Discharge: 2013-03-31 | Disposition: A | Discharge status: Home / Self Care | Payer: Medicaid Other | Source: Ambulatory Visit | Attending: Obstetrics & Gynecology | Admitting: Obstetrics & Gynecology

## 2013-03-31 ENCOUNTER — Encounter (HOSPITAL_COMMUNITY)
Admission: RE | Disposition: A | Discharge status: Home / Self Care | Payer: Self-pay | Source: Ambulatory Visit | Attending: Obstetrics & Gynecology

## 2013-03-31 DIAGNOSIS — N938 Other specified abnormal uterine and vaginal bleeding: Secondary | ICD-10-CM | POA: Insufficient documentation

## 2013-03-31 DIAGNOSIS — N939 Abnormal uterine and vaginal bleeding, unspecified: Secondary | ICD-10-CM

## 2013-03-31 DIAGNOSIS — N949 Unspecified condition associated with female genital organs and menstrual cycle: Secondary | ICD-10-CM | POA: Insufficient documentation

## 2013-03-31 DIAGNOSIS — N926 Irregular menstruation, unspecified: Secondary | ICD-10-CM

## 2013-03-31 HISTORY — DX: Other seasonal allergic rhinitis: J30.2

## 2013-03-31 HISTORY — DX: Personal history of other medical treatment: Z92.89

## 2013-03-31 HISTORY — PX: DILITATION & CURRETTAGE/HYSTROSCOPY WITH NOVASURE ABLATION: SHX5568

## 2013-03-31 LAB — CBC
HCT: 35.3 % — ABNORMAL LOW (ref 36.0–46.0)
HCT: 34.6 % — ABNORMAL LOW (ref 36.0–46.0)
Hemoglobin: 12.1 g/dL (ref 12.0–15.0)
Hemoglobin: 11.9 g/dL — ABNORMAL LOW (ref 12.0–15.0)
MCH: 30.6 pg (ref 26.0–34.0)
MCHC: 34.3 g/dL (ref 30.0–36.0)
MCHC: 34.4 g/dL (ref 30.0–36.0)
MCV: 89.6 fL (ref 78.0–100.0)
MCV: 88.9 fL (ref 78.0–100.0)
RBC: 3.94 MIL/uL (ref 3.87–5.11)
RBC: 3.89 MIL/uL (ref 3.87–5.11)
RDW: 13 % (ref 11.5–15.5)
WBC: 6.5 10*3/uL (ref 4.0–10.5)

## 2013-03-31 SURGERY — DILATATION & CURETTAGE/HYSTEROSCOPY WITH NOVASURE ABLATION
Anesthesia: General | Wound class: Clean Contaminated

## 2013-03-31 MED ORDER — ONDANSETRON HCL 4 MG/2ML IJ SOLN
INTRAMUSCULAR | Status: AC
  Filled 2013-03-31: qty 2

## 2013-03-31 MED ORDER — FENTANYL CITRATE 0.05 MG/ML IJ SOLN
INTRAMUSCULAR | Status: AC
  Filled 2013-03-31: qty 2

## 2013-03-31 MED ORDER — LIDOCAINE HCL (CARDIAC) 20 MG/ML IV SOLN
INTRAVENOUS | Status: DC | PRN
  Administered 2013-03-31: 30 mg via INTRAVENOUS

## 2013-03-31 MED ORDER — GLYCOPYRROLATE 0.2 MG/ML IJ SOLN
INTRAMUSCULAR | Status: AC
  Filled 2013-03-31: qty 1

## 2013-03-31 MED ORDER — CEFAZOLIN SODIUM-DEXTROSE 2-3 GM-% IV SOLR
2.0000 g | Freq: Once | INTRAVENOUS | Status: DC
  Filled 2013-03-31: qty 50

## 2013-03-31 MED ORDER — MIDAZOLAM HCL 2 MG/2ML IJ SOLN: 0.5000 mg | Freq: Once | INTRAMUSCULAR | Status: DC | PRN

## 2013-03-31 MED ORDER — FENTANYL CITRATE 0.05 MG/ML IJ SOLN
INTRAMUSCULAR | Status: DC | PRN
  Administered 2013-03-31: 100 ug via INTRAVENOUS
  Administered 2013-03-31 (×2): 25 ug via INTRAVENOUS
  Administered 2013-03-31: 50 ug via INTRAVENOUS

## 2013-03-31 MED ORDER — FENTANYL CITRATE 0.05 MG/ML IJ SOLN: 25.0000 ug | INTRAMUSCULAR | Status: DC | PRN

## 2013-03-31 MED ORDER — LIDOCAINE HCL 1 % IJ SOLN
INTRAMUSCULAR | Status: AC
  Filled 2013-03-31: qty 20

## 2013-03-31 MED ORDER — MEPERIDINE HCL 25 MG/ML IJ SOLN: 6.2500 mg | INTRAMUSCULAR | Status: DC | PRN

## 2013-03-31 MED ORDER — LACTATED RINGERS IV SOLN
INTRAVENOUS | Status: DC
  Administered 2013-03-31 (×2): via INTRAVENOUS

## 2013-03-31 MED ORDER — PROPOFOL 10 MG/ML IV EMUL
INTRAVENOUS | Status: AC
  Filled 2013-03-31: qty 20

## 2013-03-31 MED ORDER — GLYCOPYRROLATE 0.2 MG/ML IJ SOLN
INTRAMUSCULAR | Status: DC | PRN
  Administered 2013-03-31: 0.2 mg via INTRAVENOUS

## 2013-03-31 MED ORDER — PROMETHAZINE HCL 25 MG/ML IJ SOLN: 6.2500 mg | INTRAMUSCULAR | Status: DC | PRN

## 2013-03-31 MED ORDER — LIDOCAINE HCL 1 % IJ SOLN
INTRAMUSCULAR | Status: DC | PRN
  Administered 2013-03-31: 20 mL

## 2013-03-31 MED ORDER — DEXAMETHASONE SODIUM PHOSPHATE 10 MG/ML IJ SOLN
INTRAMUSCULAR | Status: AC
  Filled 2013-03-31: qty 1

## 2013-03-31 MED ORDER — SODIUM CHLORIDE 0.9 % IJ SOLN
INTRAMUSCULAR | Status: AC
  Filled 2013-03-31: qty 3

## 2013-03-31 MED ORDER — KETOROLAC TROMETHAMINE 30 MG/ML IJ SOLN: 15.0000 mg | Freq: Once | INTRAMUSCULAR | Status: DC | PRN

## 2013-03-31 MED ORDER — ONDANSETRON HCL 4 MG/2ML IJ SOLN
INTRAMUSCULAR | Status: DC | PRN
  Administered 2013-03-31: 4 mg via INTRAVENOUS

## 2013-03-31 MED ORDER — LIDOCAINE HCL (CARDIAC) 20 MG/ML IV SOLN
INTRAVENOUS | Status: AC
  Filled 2013-03-31: qty 5

## 2013-03-31 MED ORDER — DEXAMETHASONE SODIUM PHOSPHATE 10 MG/ML IJ SOLN
INTRAMUSCULAR | Status: DC | PRN
  Administered 2013-03-31: 10 mg via INTRAVENOUS

## 2013-03-31 MED ORDER — HYDROCODONE-ACETAMINOPHEN 5-300 MG PO TABS: 2.0000 | ORAL_TABLET | Freq: Four times a day (QID) | ORAL | Status: DC | PRN

## 2013-03-31 MED ORDER — LACTATED RINGERS IV SOLN
INTRAVENOUS | Status: DC | PRN
  Administered 2013-03-31: 3000 mL via INTRAVENOUS

## 2013-03-31 MED ORDER — MIDAZOLAM HCL 2 MG/2ML IJ SOLN
INTRAMUSCULAR | Status: AC
  Filled 2013-03-31: qty 2

## 2013-03-31 MED ORDER — MIDAZOLAM HCL 2 MG/2ML IJ SOLN
INTRAMUSCULAR | Status: DC | PRN
  Administered 2013-03-31: 2 mg via INTRAVENOUS

## 2013-03-31 MED ORDER — PROPOFOL 10 MG/ML IV BOLUS
INTRAVENOUS | Status: DC | PRN
  Administered 2013-03-31: 200 mg via INTRAVENOUS

## 2013-03-31 SURGICAL SUPPLY — 14 items
ABLATOR ENDOMETRIAL BIPOLAR (ABLATOR) ×2 IMPLANT
CANISTER SUCT 3000ML (MISCELLANEOUS) ×2 IMPLANT
CATH ROBINSON RED A/P 16FR (CATHETERS) ×2 IMPLANT
CLOTH BEACON ORANGE TIMEOUT ST (SAFETY) ×2 IMPLANT
CONTAINER PREFILL 10% NBF 60ML (FORM) ×4 IMPLANT
DRESSING TELFA 8X3 (GAUZE/BANDAGES/DRESSINGS) ×2 IMPLANT
GLOVE BIO SURGEON STRL SZ 6.5 (GLOVE) ×2 IMPLANT
GOWN PREVENTION PLUS LG XLONG (DISPOSABLE) ×4 IMPLANT
NEEDLE SPNL 20GX3.5 QUINCKE YW (NEEDLE) IMPLANT
PACK HYSTEROSCOPY LF (CUSTOM PROCEDURE TRAY) ×2 IMPLANT
PAD OB MATERNITY 4.3X12.25 (PERSONAL CARE ITEMS) ×2 IMPLANT
SCRUB PCMX 4 OZ (MISCELLANEOUS) ×2 IMPLANT
TOWEL OR 17X24 6PK STRL BLUE (TOWEL DISPOSABLE) ×4 IMPLANT
WATER STERILE IRR 1000ML POUR (IV SOLUTION) ×2 IMPLANT

## 2013-03-31 NOTE — Anesthesia Procedure Notes (Signed)
Procedure Name: LMA Insertion Date/Time: 03/31/2013 10:24 AM Performed by: Antionette Char Pre-anesthesia Checklist: Patient identified, Emergency Drugs available, Suction available and Patient being monitored Patient Re-evaluated:Patient Re-evaluated prior to inductionOxygen Delivery Method: Circle system utilized Preoxygenation: Pre-oxygenation with 100% oxygen Intubation Type: IV induction Ventilation: Mask ventilation without difficulty LMA: LMA inserted LMA Size: 3.0 Number of attempts: 1 Dental Injury: Teeth and Oropharynx as per pre-operative assessment

## 2013-03-31 NOTE — Transfer of Care (Signed)
Immediate Anesthesia Transfer of Care Note  Patient: [name] [name]  Procedure(s) Performed: Procedure(s): DILATATION & CURETTAGE/HYSTEROSCOPY WITH Attempted NOVASURE ABLATION (N/A)  Patient Location: PACU  Anesthesia Type:General  Level of Consciousness: awake, alert  and oriented  Airway & Oxygen Therapy: Patient Spontanous Breathing and Patient connected to nasal cannula oxygen  Post-op Assessment: Report given to PACU RN and Post -op Vital signs reviewed and stable  Post vital signs: Reviewed and stable  Complications: No apparent anesthesia complications

## 2013-03-31 NOTE — Op Note (Signed)
Preoperative diagnosis: Abnormal uterine bleeding  Postoperative diagnosis: Same  Procedure: Diagnostic hysteroscopy, dilatation and curettage  Surgeon: Antionette Char A  Anesthesia: laryngeal mask airway, paracervical block  Estimated blood loss: 100 ml  Urine output: per Anesthesiology  IV Fluids: per anesthesiology  Complications: Uterine perforation  Specimen: PATHOLOGY  Operative Findings: The cervix was stenotic.  On initial hysteroscopic survey, the tubal ostia were visualized bilaterally.  There were no discrete lesions.  The endometrium appeared lush on the posterior wall.  After the cavity assessment test was performed and unable to pass, further attempts to perform the ablation portion of the procedure were aborted.  The hysteroscope was reintroduced; the perforation was noted--likely in the fundus.  There was no significant bleeding noted vaginally.  Description of procedure:   The patient was taken to the operating room and placed on the operating table in the semi-lithotomy position in Thompson stirrups.  Examination under anesthesia was performed.  The patient was prepped and draped in the usual manner.  After a time-out had been completed, a speculum was placed in the vagina.  The anterior lip of the cervix was grasped with a single-toothed tenaculum.  The endocervical canal sounded to 2.5 cm.  The uterine cavity sounded to 9 cm.  The endocervical canal was dilated with Shawnie Pons dilators.  A 5 mm diagnostic hysteroscope with Glycine as the distending medium was used to perform a diagnostic hysteroscopy.  The hysteroscope was removed.  A small, Sims curette was used to perform an endometrial curettage.  The cervical canal was further dilated with Pratt dilators to a #29.  The Novasure device was test-fired and the meter went between 4.5 and 5.  The device was withdrawn into the cylinder and placed into the uterus.  The dorsal fin was set appropriately.  The device was deployed  and then seated.  The sleeve was placed up against the endocervix.  A cavity assessment test was performed and failed despite placement of an occluding Vaseline gauze.  The ablation cycle was aborted.  The device was removed and another hysteroscopic examination was performed.  All the instruments were removed from the vagina.  Final instrument counts were correct.  The patient was taken to the PACU in stable condition.

## 2013-03-31 NOTE — Anesthesia Postprocedure Evaluation (Signed)
Anesthesia Post Note  Patient: [name] [name]  Procedure(s) Performed: Procedure(s) (LRB): DILATATION & CURETTAGE/HYSTEROSCOPY WITH Attempted NOVASURE ABLATION (N/A)  Anesthesia type: General  Patient location: PACU  Post pain: Pain level controlled  Post assessment: Post-op Vital signs reviewed  Last Vitals:  Filed Vitals:   03/31/13 1145  BP:   Pulse: 70  Temp:   Resp: 11    Post vital signs: Reviewed  Level of consciousness: sedated  Complications: No apparent anesthesia complications

## 2013-03-31 NOTE — H&P (Signed)
  Chief Complaint: 32 y.o. who presents with AUB  Details of Present Illness: The patient has undergone evaluation for AUB.  After counseling regarding management options she elects to proceed with an endometrial ablation.  Pulse 74  Temp(Src) 97.9 F (36.6 C) (Oral)  Resp 16  Ht 5\' 5"  (1.651 m)  Wt 63.504 kg (140 lb)  BMI 23.3 kg/m2  SpO2 100%  LMP 02/23/2013  Past Medical History  Diagnosis Date  . Asthma     childhood - no problems as adult, no inhaler  . Seasonal allergies   . History of blood transfusion 2005    [facility] with c/s surgery   History   Social History  . Marital Status: Single    Spouse Name: N/A    Number of Children: N/A  . Years of Education: N/A   Occupational History  . Not on file.   Social History Main Topics  . Smoking status: Never Smoker   . Smokeless tobacco: Never Used  . Alcohol Use: Yes     Comment: socially  . Drug Use: No  . Sexual Activity: Yes    Partners: Male    Birth Control/ Protection: Surgical   Other Topics Concern  . Not on file   Social History Narrative  . No narrative on file   Family History  Problem Relation Age of Onset  . Diabetes Mother   . Hypertension Mother   . Hypertension Sister   . Stroke Maternal Grandmother   . Hypertension Maternal Grandmother   . Diabetes Maternal Grandmother   . Diabetes Maternal Grandfather     Pertinent items are noted in HPI.  Pre-Op Diagnosis: Abnormal Uterine Bleeding 16109   Planned Procedure: Procedure(s): DILATATION & CURETTAGE/HYSTEROSCOPY WITH NOVASURE ABLATION  I have reviewed the patient's history and have completed the physical exam and Amonda N Mcdanel is acceptable for surgery.  Roseanna Rainbow, MD 03/31/2013 10:07 AM

## 2013-03-31 NOTE — Anesthesia Preprocedure Evaluation (Addendum)
Anesthesia Evaluation  Patient identified by MRN, date of birth, ID band Patient awake    Reviewed: Allergy & Precautions, H&P , Patient's Chart, lab work & pertinent test results, reviewed documented beta blocker date and time   History of Anesthesia Complications Negative for: history of anesthetic complications  Airway Mallampati: II TM Distance: >3 FB Neck ROM: full    Dental no notable dental hx.    Pulmonary neg pulmonary ROS, asthma ,  breath sounds clear to auscultation  Pulmonary exam normal       Cardiovascular Exercise Tolerance: Good negative cardio ROS  Rhythm:regular Rate:Normal     Neuro/Psych negative neurological ROS  negative psych ROS   GI/Hepatic negative GI ROS, Neg liver ROS,   Endo/Other  negative endocrine ROS  Renal/GU negative Renal ROS     Musculoskeletal   Abdominal   Peds  Hematology negative hematology ROS (+)   Anesthesia Other Findings   Reproductive/Obstetrics negative OB ROS                           Anesthesia Physical Anesthesia Plan  ASA: II  Anesthesia Plan: General LMA   Post-op Pain Management:    Induction:   Airway Management Planned:   Additional Equipment:   Intra-op Plan:   Post-operative Plan:   Informed Consent: I have reviewed the patients History and Physical, chart, labs and discussed the procedure including the risks, benefits and alternatives for the proposed anesthesia with the patient or authorized representative who has indicated his/her understanding and acceptance.   Dental Advisory Given  Plan Discussed with: CRNA and Surgeon  Anesthesia Plan Comments:        Anesthesia Quick Evaluation

## 2013-04-03 ENCOUNTER — Encounter (HOSPITAL_COMMUNITY): Payer: Self-pay | Admitting: Obstetrics & Gynecology

## 2013-04-05 ENCOUNTER — Encounter: Payer: Self-pay | Admitting: Obstetrics & Gynecology

## 2013-04-05 ENCOUNTER — Ambulatory Visit (INDEPENDENT_AMBULATORY_CARE_PROVIDER_SITE_OTHER): Payer: Medicaid Other | Admitting: Obstetrics & Gynecology

## 2013-04-05 VITALS — BP 115/86 | HR 71 | Temp 97.5°F | Ht 65.0 in | Wt 137.0 lb

## 2013-04-05 DIAGNOSIS — Z09 Encounter for follow-up examination after completed treatment for conditions other than malignant neoplasm: Secondary | ICD-10-CM

## 2013-04-05 MED ORDER — NORETHINDRONE ACETATE 5 MG PO TABS: 5.0000 mg | ORAL_TABLET | Freq: Two times a day (BID) | ORAL | Status: DC

## 2013-04-05 MED ORDER — PRAMOXINE HCL 1 % RE FOAM: 1.0000 "application " | Freq: Three times a day (TID) | RECTAL | Status: DC | PRN

## 2013-04-05 NOTE — Progress Notes (Signed)
Subjective:     [name] [name] is a 32 y.o. female here for a routine exam.  Current complaints: follow up. Pt states there was an attempt for a Novasure. Pt states it was unsuccessful.   Personal health questionnaire reviewed: yes.   Gynecologic History Patient's last menstrual period was 03/17/2013. Contraception: tubal ligation  Obstetric History OB History  Gravida Para Term Preterm AB SAB TAB Ectopic Multiple Living  3 3 3       3     # Outcome Date GA Lbr Len/2nd Weight Sex Delivery Anes PTL Lv  3 TRM     M LTCS  N Y  2 TRM     F LTCS  N Y  1 TRM     M LTCS   Y     Comments: unable to deliver- "shoulders wouldn't pass"       The following portions of the patient's history were reviewed and updated as appropriate: allergies, current medications, past family history, past medical history, past social history, past surgical history and problem list.  Review of Systems Pertinent items are noted in HPI.    Objective:   Abd: NT  Assessment:   AUB S/P attempted Novasure ablation complicated by uterine perforation  Plan:   Management options Return in 1 mth

## 2013-04-06 ENCOUNTER — Other Ambulatory Visit: Payer: Self-pay

## 2013-04-08 NOTE — Patient Instructions (Signed)
Hysterectomy Information  A hysterectomy is a procedure where your uterus is surgically removed. It will no longer be possible to have menstrual periods or to become pregnant. The tubes and ovaries can be removed (bilateral salpingo-oopherectomy) during this surgery as well.  REASONS FOR A HYSTERECTOMY  Persistent, abnormal bleeding.  Lasting (chronic) pelvic pain or infection.  The lining of the uterus (endometrium) starts growing outside the uterus (endometriosis).  The endometrium starts growing in the muscle of the uterus (adenomyosis).  The uterus falls down into the vagina (pelvic organ prolapse).  Symptomatic uterine fibroids.  Precancerous cells.  Cervical cancer or uterine cancer. TYPES OF HYSTERECTOMIES  Supracervical hysterectomy. This type removes the top part of the uterus, but not the cervix.  Total hysterectomy. This type removes the uterus and cervix.  Radical hysterectomy. This type removes the uterus, cervix, and the fibrous tissue that holds the uterus in place in the pelvis (parametrium). WAYS A HYSTERECTOMY CAN BE PERFORMED  Abdominal hysterectomy. A large surgical cut (incision) is made in the abdomen. The uterus is removed through this incision.  Vaginal hysterectomy. An incision is made in the vagina. The uterus is removed through this incision. There are no abdominal incisions.  Conventional laparoscopic hysterectomy. A thin, lighted tube with a camera (laparoscope) is inserted into 3 or 4 small incisions in the abdomen. The uterus is cut into small pieces. The small pieces are removed through the incisions, or they are removed through the vagina.  Laparoscopic assisted vaginal hysterectomy (LAVH). Three or four small incisions are made in the abdomen. Part of the surgery is performed laparoscopically and part vaginally. The uterus is removed through the vagina.  Robot-assisted laparoscopic hysterectomy. A laparoscope is inserted into 3 or 4 small  incisions in the abdomen. A computer-controlled device is used to give the surgeon a 3D image. This allows for more precise movements of surgical instruments. The uterus is cut into small pieces and removed through the incisions or removed through the vagina. RISKS OF HYSTERECTOMY   Bleeding and risk of blood transfusion. Tell your caregiver if you do not want to receive any blood products.  Blood clots in the legs or lung.  Infection.  Injury to surrounding organs.  Anesthesia problems or side effects.  Conversion to an abdominal hysterectomy. WHAT TO EXPECT AFTER A HYSTERECTOMY  You will be given pain medicine.  You will need to have someone with you for the first 3 to 5 days after you go home.  You will need to follow up with your surgeon in 2 to 4 weeks after surgery to evaluate your progress.  You may have early menopause symptoms like hot flashes, night sweats, and insomnia.  If you had a hysterectomy for a problem that was not a cancer or a condition that could lead to cancer, then you no longer need Pap tests. However, even if you no longer need a Pap test, a regular exam is a good idea to make sure no other problems are starting. Document Released: 11/11/2000 Document Revised: 08/10/2011 Document Reviewed: 12/27/2010 [employer] Patient Information 2014 [employer].  

## 2013-04-12 ENCOUNTER — Encounter: Payer: Self-pay | Admitting: Obstetrics & Gynecology

## 2013-05-10 ENCOUNTER — Ambulatory Visit (INDEPENDENT_AMBULATORY_CARE_PROVIDER_SITE_OTHER): Payer: Medicaid Other | Admitting: Obstetrics & Gynecology

## 2013-05-10 DIAGNOSIS — J45909 Unspecified asthma, uncomplicated: Secondary | ICD-10-CM

## 2013-06-05 MED ORDER — VALACYCLOVIR HCL 500 MG PO TABS: 500.0000 mg | ORAL_TABLET | Freq: Two times a day (BID) | ORAL | Status: DC

## 2013-06-05 MED ORDER — METRONIDAZOLE 500 MG PO TABS: 500.0000 mg | ORAL_TABLET | Freq: Two times a day (BID) | ORAL | Status: DC

## 2013-06-07 ENCOUNTER — Encounter: Payer: Self-pay | Admitting: Obstetrics & Gynecology

## 2013-06-08 ENCOUNTER — Encounter: Payer: Self-pay | Admitting: Obstetrics & Gynecology

## 2013-06-08 ENCOUNTER — Ambulatory Visit (INDEPENDENT_AMBULATORY_CARE_PROVIDER_SITE_OTHER): Payer: Medicaid Other | Admitting: Obstetrics & Gynecology

## 2013-06-08 VITALS — BP 131/90 | HR 68 | Temp 98.2°F | Wt 139.0 lb

## 2013-06-08 DIAGNOSIS — Z09 Encounter for follow-up examination after completed treatment for conditions other than malignant neoplasm: Secondary | ICD-10-CM

## 2013-06-08 NOTE — Progress Notes (Signed)
Subjective:     [name] [name] is a 33 y.o. female here for a follow up exam.  Current complaints: Pt has no complaints today.  Pt is in office to f/u from last visit.  Pt previously had ablation with no success.  Pt is here to discuss plan of care.  Personal health questionnaire reviewed: yes.   Gynecologic History Patient's last menstrual period was 06/01/2013. Contraception: tubal ligation Last Pap: 01/2013   Obstetric History OB History  Gravida Para Term Preterm AB SAB TAB Ectopic Multiple Living  3 3 3       3     # Outcome Date GA Lbr Len/2nd Weight Sex Delivery Anes PTL Lv  3 TRM     M LTCS  N Y  2 TRM     F LTCS  N Y  1 TRM     M LTCS   Y     Comments: unable to deliver- "shoulders wouldn't pass"       The following portions of the patient's history were reviewed and updated as appropriate: allergies, current medications, past family history, past medical history, past social history, past surgical history and problem list.  Review of Systems Pertinent items are noted in HPI.    Objective:      General:  alert     Abdomen: soft, non-tender; bowel sounds normal; no masses,  no organomegaly   Vulva:  normal  Vagina: normal vagina  Cervix:  no lesions  Corpus: normal size, contour, position, consistency, mobility, non-tender  Adnexa:  normal adnexa       Assessment:   AUB--?E, O; refractory to attempts to manage medically  Plan:   She elects to proceed with an attempted TRH/possible TAH.  The risks/benefits were reviewed.

## 2013-06-09 ENCOUNTER — Encounter: Payer: Self-pay | Admitting: Obstetrics & Gynecology

## 2013-06-09 NOTE — Patient Instructions (Signed)
Hysterectomy Information  A hysterectomy is a procedure where your uterus is surgically removed. It will no longer be possible to have menstrual periods or to become pregnant. The tubes and ovaries can be removed (bilateral salpingo-oopherectomy) during this surgery as well.  REASONS FOR A HYSTERECTOMY  Persistent, abnormal bleeding.  Lasting (chronic) pelvic pain or infection.  The lining of the uterus (endometrium) starts growing outside the uterus (endometriosis).  The endometrium starts growing in the muscle of the uterus (adenomyosis).  The uterus falls down into the vagina (pelvic organ prolapse).  Symptomatic uterine fibroids.  Precancerous cells.  Cervical cancer or uterine cancer. TYPES OF HYSTERECTOMIES  Supracervical hysterectomy. This type removes the top part of the uterus, but not the cervix.  Total hysterectomy. This type removes the uterus and cervix.  Radical hysterectomy. This type removes the uterus, cervix, and the fibrous tissue that holds the uterus in place in the pelvis (parametrium). WAYS A HYSTERECTOMY CAN BE PERFORMED  Abdominal hysterectomy. A large surgical cut (incision) is made in the abdomen. The uterus is removed through this incision.  Vaginal hysterectomy. An incision is made in the vagina. The uterus is removed through this incision. There are no abdominal incisions.  Conventional laparoscopic hysterectomy. A thin, lighted tube with a camera (laparoscope) is inserted into 3 or 4 small incisions in the abdomen. The uterus is cut into small pieces. The small pieces are removed through the incisions, or they are removed through the vagina.  Laparoscopic assisted vaginal hysterectomy (LAVH). Three or four small incisions are made in the abdomen. Part of the surgery is performed laparoscopically and part vaginally. The uterus is removed through the vagina.  Robot-assisted laparoscopic hysterectomy. A laparoscope is inserted into 3 or 4 small  incisions in the abdomen. A computer-controlled device is used to give the surgeon a 3D image. This allows for more precise movements of surgical instruments. The uterus is cut into small pieces and removed through the incisions or removed through the vagina. RISKS OF HYSTERECTOMY   Bleeding and risk of blood transfusion. Tell your caregiver if you do not want to receive any blood products.  Blood clots in the legs or lung.  Infection.  Injury to surrounding organs.  Anesthesia problems or side effects.  Conversion to an abdominal hysterectomy. WHAT TO EXPECT AFTER A HYSTERECTOMY  You will be given pain medicine.  You will need to have someone with you for the first 3 to 5 days after you go home.  You will need to follow up with your surgeon in 2 to 4 weeks after surgery to evaluate your progress.  You may have early menopause symptoms like hot flashes, night sweats, and insomnia.  If you had a hysterectomy for a problem that was not a cancer or a condition that could lead to cancer, then you no longer need Pap tests. However, even if you no longer need a Pap test, a regular exam is a good idea to make sure no other problems are starting. Document Released: 11/11/2000 Document Revised: 08/10/2011 Document Reviewed: 12/27/2010 [employer] Patient Information 2014 [employer].  

## 2013-06-12 ENCOUNTER — Telehealth: Payer: Self-pay | Admitting: *Deleted

## 2013-06-15 ENCOUNTER — Other Ambulatory Visit: Payer: Self-pay | Admitting: *Deleted

## 2013-06-23 ENCOUNTER — Encounter: Payer: Self-pay | Admitting: Obstetrics & Gynecology

## 2013-06-23 NOTE — Progress Notes (Signed)
Pt not seen.

## 2013-06-26 ENCOUNTER — Encounter: Payer: Self-pay | Admitting: *Deleted

## 2013-06-26 ENCOUNTER — Telehealth: Payer: Self-pay | Admitting: *Deleted

## 2013-06-26 NOTE — Telephone Encounter (Signed)
Patient has been notified and scheduled for labs- she no showed. Copy of recommended labs sent to patient.

## 2013-06-26 NOTE — Telephone Encounter (Signed)
Message copied by Elson AreasPAUL, JANE F on Mon Jun 26, 2013 11:49 AM ------      Message from: Antionette CharJACKSON-MOORE, LISA      Created: Fri Jun 09, 2013  8:45 AM       Ask Chip BoerVicki to to check if we have ever gotten a PT/PTT/von willebrand's/PFA-100 on this pt.  If we haven't let's have these labs drawn. ------

## 2013-07-21 ENCOUNTER — Encounter (HOSPITAL_COMMUNITY): Admission: RE | Payer: Self-pay | Source: Ambulatory Visit

## 2013-07-21 ENCOUNTER — Ambulatory Visit (HOSPITAL_COMMUNITY)
Admission: RE | Admit: 2013-07-21 | Payer: No Typology Code available for payment source | Source: Ambulatory Visit | Admitting: Obstetrics & Gynecology

## 2013-07-21 SURGERY — ROBOTIC ASSISTED TOTAL HYSTERECTOMY
Anesthesia: Choice

## 2013-07-28 ENCOUNTER — Encounter (HOSPITAL_COMMUNITY): Payer: Self-pay | Admitting: Pharmacy Technician

## 2013-08-01 ENCOUNTER — Inpatient Hospital Stay (HOSPITAL_COMMUNITY): Admission: RE | Admit: 2013-08-01 | Payer: Medicaid Other | Source: Ambulatory Visit

## 2013-08-01 ENCOUNTER — Other Ambulatory Visit (HOSPITAL_COMMUNITY): Payer: Self-pay | Admitting: Obstetrics & Gynecology

## 2013-08-01 NOTE — Patient Instructions (Addendum)
21 [name] [name]  08/01/2013   Your procedure is scheduled on: Friday March 6th  Report to [name] at 515 AM.  Call this number if you have problems the morning of surgery [phone]   Remember: Call [name] with your medicaid card number if you find it out [phone] phone, remove all piercings  Do not eat food or drink liquids :After Midnight.     Take these medicines the morning of surgery with A SIP OF WATER: no meds to take                                SEE [facility] PREPARING FOR SURGERY SHEET             You may not have any metal on your body including hair pins and piercings  Do not wear jewelry, make-up.  Do not wear lotions, powders, or perfumes. No  Deodorant is to be worn.   Men may shave face and neck.  Do not bring valuables to the hospital. [name] IS NOT RESPONSIBLE FOR VALUEABLES.  Contacts, dentures or bridgework may not be worn into surgery.  Leave suitcase in the car. After surgery it may be brought to your room.  For patients admitted to the hospital, checkout time is 11:00 AM the day of discharge.   Patients discharged the day of surgery will not be allowed to drive home.  Name and phone number of your driver:  Special Instructions: N/A  Please read over the following fact sheets that you were given: [facility] Preparing for surgery sheet blood fact sheet. Call [name] RN pre op nurse if needed [phone]    FAILURE TO FOLLOW THESE INSTRUCTIONS MAY RESULT IN THE CANCELLATION OF YOUR SURGERY.  PATIENT SIGNATURE___________________________________________  NURSE SIGNATURE_____________________________________________

## 2013-08-02 ENCOUNTER — Encounter (HOSPITAL_COMMUNITY)
Admission: RE | Admit: 2013-08-02 | Discharge: 2013-08-02 | Disposition: A | Discharge status: Home / Self Care | Payer: Medicaid Other | Source: Ambulatory Visit | Attending: Obstetrics & Gynecology | Admitting: Obstetrics & Gynecology

## 2013-08-02 ENCOUNTER — Encounter (HOSPITAL_COMMUNITY): Payer: Self-pay

## 2013-08-02 ENCOUNTER — Ambulatory Visit (HOSPITAL_COMMUNITY)
Admission: RE | Admit: 2013-08-02 | Discharge: 2013-08-02 | Disposition: A | Discharge status: Home / Self Care | Payer: Medicaid Other | Source: Ambulatory Visit | Attending: Obstetrics & Gynecology | Admitting: Obstetrics & Gynecology

## 2013-08-02 DIAGNOSIS — Z01818 Encounter for other preprocedural examination: Secondary | ICD-10-CM | POA: Insufficient documentation

## 2013-08-02 DIAGNOSIS — Z01812 Encounter for preprocedural laboratory examination: Secondary | ICD-10-CM | POA: Insufficient documentation

## 2013-08-02 LAB — CBC
HCT: 40.9 % (ref 36.0–46.0)
Hemoglobin: 13.7 g/dL (ref 12.0–15.0)
MCH: 30.5 pg (ref 26.0–34.0)
MCHC: 33.5 g/dL (ref 30.0–36.0)
MCV: 91.1 fL (ref 78.0–100.0)
Platelets: 225 10*3/uL (ref 150–400)
RBC: 4.49 MIL/uL (ref 3.87–5.11)
RDW: 12.7 % (ref 11.5–15.5)
WBC: 5.8 10*3/uL (ref 4.0–10.5)

## 2013-08-02 LAB — HCG, SERUM, QUALITATIVE: Preg, Serum: NEGATIVE

## 2013-08-02 LAB — ABO/RH: ABO/RH(D): A POS

## 2013-08-02 NOTE — H&P (Signed)
Subjective:  [name] [name] is a 33 y.o.  para 3, female. She has abnormal uterine bleeding.  Bleeding is characterized as moderate.   Evaluation to date: D & C: negative and pelvic ultrasound: negative. Treatment to date: failed attempted endometrial ablation with a uterine perforation.  Pertinent Gyn History:  Menses flow is moderate Contraception: tubal ligation Blood transfusions: with a C/D Last pap: normal Date: '13  Patient Active Problem List   Diagnosis Date Noted  . ASTHMA, UNSPECIFIED 07/29/2006   Past Medical History  Diagnosis Date  . Seasonal allergies   . History of blood transfusion 2005    [facility] with c/s surgery  . Asthma     childhood - no problems as adult, no inhaler    Past Surgical History  Procedure Laterality Date  . Cesarean section      x 3  . Tubal ligation    . Wisdom tooth extraction    . Dilitation & currettage/hystroscopy with novasure ablation N/A 03/31/2013    Procedure: DILATATION & CURETTAGE/HYSTEROSCOPY WITH Attempted NOVASURE ABLATION;  Surgeon: Antionette Char, MD;  Location: [facility];  Service: Gynecology;  Laterality: N/A;    No prescriptions prior to admission   No Known Allergies  History  Substance Use Topics  . Smoking status: Never Smoker   . Smokeless tobacco: Never Used  . Alcohol Use: Yes     Comment: socially    Family History  Problem Relation Age of Onset  . Diabetes Mother   . Hypertension Mother   . Hypertension Sister   . Stroke Maternal Grandmother   . Hypertension Maternal Grandmother   . Diabetes Maternal Grandmother   . Diabetes Maternal Grandfather      Review of Systems Pertinent items are noted in HPI.    Objective:   Vital signs in last 24 hours: Temp:  [98.5 F (36.9 C)] 98.5 F (36.9 C) (03/04 0817) Pulse Rate:  [65] 65 (03/04 0817) Resp:  [16] 16 (03/04 0817) BP: (116)/(88) 116/88 mmHg (03/04 0817) SpO2:  [100 %] 100 % (03/04 0817) Weight:  [141 lb 3.2 oz (64.048 kg)] 141 lb 3.2 oz  (64.048 kg) (03/04 0817)     General Appearance:    Alert, cooperative, no distress, appears stated age  Head:    Normocephalic, without obvious abnormality, atraumatic  Eyes:    PERRL, conjunctiva/corneas clear, EOM's intact, fundi    benign, both eyes  Ears:    Normal TM's and external ear canals, both ears  Nose:   Nares normal, septum midline, mucosa normal, no drainage    or sinus tenderness  Throat:   Lips, mucosa, and tongue normal; teeth and gums normal  Neck:   Supple, symmetrical, trachea midline, no adenopathy;    thyroid:  no enlargement/tenderness/nodules; no carotid   bruit or JVD  Back:     Symmetric, no curvature, ROM normal, no CVA tenderness  Lungs:     Clear to auscultation bilaterally, respirations unlabored  Chest Wall:    No tenderness or deformity   Heart:    Regular rate and rhythm, S1 and S2 normal, no murmur, rub   or gallop  Breast Exam:    No tenderness, masses, or nipple abnormality  Abdomen:     Soft, non-tender, bowel sounds active all four quadrants,    no masses, no organomegaly  Genitalia:    Normal female without lesion, discharge or tenderness  Extremities:   Extremities normal, atraumatic, no cyanosis or edema  Pulses:   2+  and symmetric all extremities  Skin:   Skin color, texture, turgor normal, no rashes or lesions  Lymph nodes:   Cervical, supraclavicular, and axillary nodes normal  Neurologic:   CNII-XII intact, normal strength, sensation and reflexes    throughout    Assessment/Plan: AUB--?E,O  A TRH, possible TAH is planned with salpingectomies  I had a lengthy discussion with the patient regarding her bleeding and consideration for repeated attempt at  endometrial ablation versus hysterectomy.  Procedure, risks, reasons, benefits and complications (including injury to bowel, bladder, major blood vessel, ureter, bleeding, possibility of transfusion, infection, thromboembolism or fistula formation) were reviewed in detail. Consent was signed  and preop testing was ordered.  Instructions were reviewed, including NPO after midnight.

## 2013-08-02 NOTE — Progress Notes (Addendum)
Medicaid sterilization consent signed by pt and witness and placed on pt chart ekg 04-05-2013 [facility] on chart

## 2013-08-03 ENCOUNTER — Encounter: Payer: Self-pay | Admitting: Obstetrics & Gynecology

## 2013-08-03 NOTE — Anesthesia Preprocedure Evaluation (Addendum)
Anesthesia Evaluation  Patient identified by MRN, date of birth, ID band Patient awake    Reviewed: Allergy & Precautions, H&P , Patient's Chart, lab work & pertinent test results, reviewed documented beta blocker date and time   History of Anesthesia Complications Negative for: history of anesthetic complications  Airway Mallampati: II TM Distance: >3 FB Neck ROM: full    Dental no notable dental hx. (+) Teeth Intact, Dental Advisory Given   Pulmonary neg pulmonary ROS, asthma ,  breath sounds clear to auscultation  Pulmonary exam normal       Cardiovascular Exercise Tolerance: Good negative cardio ROS  Rhythm:regular Rate:Normal     Neuro/Psych negative neurological ROS  negative psych ROS   GI/Hepatic negative GI ROS, Neg liver ROS,   Endo/Other  negative endocrine ROS  Renal/GU negative Renal ROS     Musculoskeletal   Abdominal   Peds  Hematology negative hematology ROS (+)   Anesthesia Other Findings   Reproductive/Obstetrics negative OB ROS                          Anesthesia Physical Anesthesia Plan  ASA: II  Anesthesia Plan: General   Post-op Pain Management:    Induction: Intravenous  Airway Management Planned: Oral ETT  Additional Equipment:   Intra-op Plan:   Post-operative Plan: Extubation in OR  Informed Consent: I have reviewed the patients History and Physical, chart, labs and discussed the procedure including the risks, benefits and alternatives for the proposed anesthesia with the patient or authorized representative who has indicated his/her understanding and acceptance.   Dental advisory given  Plan Discussed with: CRNA  Anesthesia Plan Comments:         Anesthesia Quick Evaluation

## 2013-08-04 ENCOUNTER — Encounter (HOSPITAL_COMMUNITY): Payer: Medicaid Other | Admitting: Anesthesiology

## 2013-08-04 ENCOUNTER — Encounter (HOSPITAL_COMMUNITY)
Admission: RE | Disposition: A | Discharge status: Home / Self Care | Payer: Self-pay | Source: Ambulatory Visit | Attending: Obstetrics & Gynecology

## 2013-08-04 ENCOUNTER — Ambulatory Visit (HOSPITAL_COMMUNITY): Payer: Medicaid Other | Admitting: Anesthesiology

## 2013-08-04 ENCOUNTER — Inpatient Hospital Stay (HOSPITAL_COMMUNITY)
Admission: RE | Admit: 2013-08-04 | Discharge: 2013-08-05 | DRG: 743 | Disposition: A | Discharge status: Home / Self Care | Payer: Medicaid Other | Source: Ambulatory Visit | Attending: Obstetrics & Gynecology | Admitting: Obstetrics & Gynecology

## 2013-08-04 ENCOUNTER — Encounter (HOSPITAL_COMMUNITY): Payer: Self-pay | Admitting: *Deleted

## 2013-08-04 DIAGNOSIS — Z833 Family history of diabetes mellitus: Secondary | ICD-10-CM | POA: Diagnosis not present

## 2013-08-04 DIAGNOSIS — N939 Abnormal uterine and vaginal bleeding, unspecified: Secondary | ICD-10-CM | POA: Diagnosis present

## 2013-08-04 DIAGNOSIS — Z823 Family history of stroke: Secondary | ICD-10-CM

## 2013-08-04 DIAGNOSIS — Z8249 Family history of ischemic heart disease and other diseases of the circulatory system: Secondary | ICD-10-CM

## 2013-08-04 DIAGNOSIS — N926 Irregular menstruation, unspecified: Secondary | ICD-10-CM | POA: Diagnosis present

## 2013-08-04 DIAGNOSIS — N920 Excessive and frequent menstruation with regular cycle: Secondary | ICD-10-CM

## 2013-08-04 HISTORY — PX: ROBOTIC ASSISTED TOTAL HYSTERECTOMY: SHX6085

## 2013-08-04 HISTORY — PX: BILATERAL SALPINGECTOMY: SHX5743

## 2013-08-04 LAB — TYPE AND SCREEN
ABO/RH(D): A POS
Antibody Screen: NEGATIVE

## 2013-08-04 SURGERY — ROBOTIC ASSISTED TOTAL HYSTERECTOMY
Anesthesia: General | Site: Abdomen

## 2013-08-04 MED ORDER — PROPOFOL 10 MG/ML IV BOLUS
INTRAVENOUS | Status: DC | PRN
  Administered 2013-08-04: 150 mg via INTRAVENOUS

## 2013-08-04 MED ORDER — HYDROMORPHONE HCL PF 1 MG/ML IJ SOLN
0.2000 mg | INTRAMUSCULAR | Status: DC | PRN
  Administered 2013-08-04 (×2): 0.5 mg via INTRAVENOUS
  Filled 2013-08-04 (×2): qty 1

## 2013-08-04 MED ORDER — ONDANSETRON HCL 4 MG/2ML IJ SOLN
INTRAMUSCULAR | Status: DC | PRN
  Administered 2013-08-04: 4 mg via INTRAVENOUS

## 2013-08-04 MED ORDER — CEFAZOLIN SODIUM-DEXTROSE 2-3 GM-% IV SOLR
INTRAVENOUS | Status: AC
  Filled 2013-08-04: qty 50

## 2013-08-04 MED ORDER — DEXTROSE-NACL 5-0.45 % IV SOLN
INTRAVENOUS | Status: DC
  Administered 2013-08-04: 12:00:00 via INTRAVENOUS
  Administered 2013-08-04 – 2013-08-05 (×2): 1 mL via INTRAVENOUS

## 2013-08-04 MED ORDER — EPHEDRINE SULFATE 50 MG/ML IJ SOLN
INTRAMUSCULAR | Status: AC
  Filled 2013-08-04: qty 1

## 2013-08-04 MED ORDER — METRONIDAZOLE IN NACL 5-0.79 MG/ML-% IV SOLN
500.0000 mg | Freq: Once | INTRAVENOUS | Status: AC
  Administered 2013-08-04: 500 mg via INTRAVENOUS
  Filled 2013-08-04: qty 100

## 2013-08-04 MED ORDER — HYDROMORPHONE HCL PF 1 MG/ML IJ SOLN
0.2500 mg | INTRAMUSCULAR | Status: DC | PRN
  Administered 2013-08-04 (×3): 0.5 mg via INTRAVENOUS

## 2013-08-04 MED ORDER — ACETAMINOPHEN 10 MG/ML IV SOLN
1000.0000 mg | Freq: Once | INTRAVENOUS | Status: AC
  Administered 2013-08-04: 1000 mg via INTRAVENOUS
  Filled 2013-08-04: qty 100

## 2013-08-04 MED ORDER — GLYCOPYRROLATE 0.2 MG/ML IJ SOLN
INTRAMUSCULAR | Status: AC
  Filled 2013-08-04: qty 3

## 2013-08-04 MED ORDER — PANTOPRAZOLE SODIUM 40 MG PO TBEC
40.0000 mg | DELAYED_RELEASE_TABLET | Freq: Every day | ORAL | Status: DC
  Administered 2013-08-05: 40 mg via ORAL
  Filled 2013-08-04: qty 1

## 2013-08-04 MED ORDER — KETOROLAC TROMETHAMINE 15 MG/ML IJ SOLN
30.0000 mg | Freq: Four times a day (QID) | INTRAMUSCULAR | Status: DC
  Administered 2013-08-04 – 2013-08-05 (×3): 30 mg via INTRAVENOUS
  Filled 2013-08-04 (×8): qty 2

## 2013-08-04 MED ORDER — HYDROMORPHONE HCL PF 1 MG/ML IJ SOLN
INTRAMUSCULAR | Status: AC
  Administered 2013-08-04: 0.5 mg via INTRAVENOUS
  Filled 2013-08-04: qty 1

## 2013-08-04 MED ORDER — LACTATED RINGERS IV SOLN: INTRAVENOUS | Status: DC

## 2013-08-04 MED ORDER — SIMETHICONE 80 MG PO CHEW
80.0000 mg | CHEWABLE_TABLET | Freq: Four times a day (QID) | ORAL | Status: DC | PRN
  Filled 2013-08-04: qty 1

## 2013-08-04 MED ORDER — ONDANSETRON HCL 4 MG/2ML IJ SOLN
INTRAMUSCULAR | Status: AC
  Filled 2013-08-04: qty 2

## 2013-08-04 MED ORDER — LIDOCAINE HCL (CARDIAC) 20 MG/ML IV SOLN
INTRAVENOUS | Status: AC
  Filled 2013-08-04: qty 5

## 2013-08-04 MED ORDER — OXYCODONE-ACETAMINOPHEN 5-325 MG PO TABS
1.0000 | ORAL_TABLET | ORAL | Status: DC | PRN
  Administered 2013-08-05: 1 via ORAL
  Filled 2013-08-04: qty 1

## 2013-08-04 MED ORDER — ROCURONIUM BROMIDE 100 MG/10ML IV SOLN
INTRAVENOUS | Status: DC | PRN
Start: 2013-08-04 — End: 2013-08-04
  Administered 2013-08-04: 10 mg via INTRAVENOUS
  Administered 2013-08-04: 50 mg via INTRAVENOUS

## 2013-08-04 MED ORDER — LACTATED RINGERS IV SOLN
INTRAVENOUS | Status: DC | PRN
  Administered 2013-08-04 (×2): via INTRAVENOUS

## 2013-08-04 MED ORDER — FENTANYL CITRATE 0.05 MG/ML IJ SOLN
INTRAMUSCULAR | Status: DC | PRN
  Administered 2013-08-04 (×7): 50 ug via INTRAVENOUS

## 2013-08-04 MED ORDER — HYDROMORPHONE HCL PF 1 MG/ML IJ SOLN
INTRAMUSCULAR | Status: AC
  Filled 2013-08-04: qty 1

## 2013-08-04 MED ORDER — BUPIVACAINE HCL (PF) 0.25 % IJ SOLN
INTRAMUSCULAR | Status: AC
  Filled 2013-08-04: qty 30

## 2013-08-04 MED ORDER — METRONIDAZOLE IN NACL 5-0.79 MG/ML-% IV SOLN
INTRAVENOUS | Status: AC
  Filled 2013-08-04: qty 100

## 2013-08-04 MED ORDER — PROPOFOL 10 MG/ML IV BOLUS
INTRAVENOUS | Status: AC
  Filled 2013-08-04: qty 20

## 2013-08-04 MED ORDER — DEXAMETHASONE SODIUM PHOSPHATE 10 MG/ML IJ SOLN
INTRAMUSCULAR | Status: DC | PRN
  Administered 2013-08-04: 10 mg via INTRAVENOUS

## 2013-08-04 MED ORDER — NEOSTIGMINE METHYLSULFATE 1 MG/ML IJ SOLN
INTRAMUSCULAR | Status: AC
  Filled 2013-08-04: qty 10

## 2013-08-04 MED ORDER — CEFAZOLIN SODIUM-DEXTROSE 2-3 GM-% IV SOLR
2.0000 g | INTRAVENOUS | Status: AC
  Administered 2013-08-04: 2 g via INTRAVENOUS

## 2013-08-04 MED ORDER — GLYCOPYRROLATE 0.2 MG/ML IJ SOLN
INTRAMUSCULAR | Status: DC | PRN
  Administered 2013-08-04: 0.6 mg via INTRAVENOUS

## 2013-08-04 MED ORDER — BUPIVACAINE HCL (PF) 0.25 % IJ SOLN
INTRAMUSCULAR | Status: DC | PRN
  Administered 2013-08-04: 10 mL

## 2013-08-04 MED ORDER — ONDANSETRON HCL 4 MG PO TABS
4.0000 mg | ORAL_TABLET | Freq: Four times a day (QID) | ORAL | Status: DC | PRN
Start: 2013-08-04 — End: 2013-08-05

## 2013-08-04 MED ORDER — LIDOCAINE HCL (CARDIAC) 20 MG/ML IV SOLN
INTRAVENOUS | Status: DC | PRN
  Administered 2013-08-04: 50 mg via INTRAVENOUS

## 2013-08-04 MED ORDER — FENTANYL CITRATE 0.05 MG/ML IJ SOLN
INTRAMUSCULAR | Status: AC
  Filled 2013-08-04: qty 2

## 2013-08-04 MED ORDER — NEOSTIGMINE METHYLSULFATE 1 MG/ML IJ SOLN
INTRAMUSCULAR | Status: DC | PRN
  Administered 2013-08-04: 4 mg via INTRAVENOUS

## 2013-08-04 MED ORDER — PROMETHAZINE HCL 25 MG/ML IJ SOLN: 6.2500 mg | INTRAMUSCULAR | Status: DC | PRN

## 2013-08-04 MED ORDER — ATROPINE SULFATE 0.4 MG/ML IJ SOLN
INTRAMUSCULAR | Status: AC
  Filled 2013-08-04: qty 1

## 2013-08-04 MED ORDER — MIDAZOLAM HCL 5 MG/5ML IJ SOLN
INTRAMUSCULAR | Status: DC | PRN
  Administered 2013-08-04: 0.5 mg via INTRAVENOUS
  Administered 2013-08-04: 1 mg via INTRAVENOUS
  Administered 2013-08-04: 0.5 mg via INTRAVENOUS

## 2013-08-04 MED ORDER — MENTHOL 3 MG MT LOZG
1.0000 | LOZENGE | OROMUCOSAL | Status: DC | PRN
  Filled 2013-08-04: qty 9

## 2013-08-04 MED ORDER — ROCURONIUM BROMIDE 100 MG/10ML IV SOLN
INTRAVENOUS | Status: AC
  Filled 2013-08-04: qty 1

## 2013-08-04 MED ORDER — SODIUM CHLORIDE 0.9 % IJ SOLN
INTRAMUSCULAR | Status: AC
  Filled 2013-08-04: qty 10

## 2013-08-04 MED ORDER — FENTANYL CITRATE 0.05 MG/ML IJ SOLN
INTRAMUSCULAR | Status: AC
  Filled 2013-08-04: qty 5

## 2013-08-04 MED ORDER — KETOROLAC TROMETHAMINE 15 MG/ML IJ SOLN
30.0000 mg | Freq: Four times a day (QID) | INTRAMUSCULAR | Status: DC
  Filled 2013-08-04 (×5): qty 2

## 2013-08-04 MED ORDER — DEXAMETHASONE SODIUM PHOSPHATE 10 MG/ML IJ SOLN
INTRAMUSCULAR | Status: AC
  Filled 2013-08-04: qty 1

## 2013-08-04 MED ORDER — PROMETHAZINE HCL 25 MG/ML IJ SOLN
12.5000 mg | Freq: Four times a day (QID) | INTRAMUSCULAR | Status: DC | PRN
  Filled 2013-08-04: qty 1

## 2013-08-04 MED ORDER — DOCUSATE SODIUM 100 MG PO CAPS
100.0000 mg | ORAL_CAPSULE | Freq: Two times a day (BID) | ORAL | Status: DC
  Administered 2013-08-04 – 2013-08-05 (×2): 100 mg via ORAL
  Filled 2013-08-04 (×5): qty 1

## 2013-08-04 MED ORDER — MAGNESIUM HYDROXIDE 400 MG/5ML PO SUSP: 30.0000 mL | Freq: Every day | ORAL | Status: DC | PRN

## 2013-08-04 MED ORDER — STERILE WATER FOR IRRIGATION IR SOLN
Status: DC | PRN
  Administered 2013-08-04: 3000 mL

## 2013-08-04 MED ORDER — KETOROLAC TROMETHAMINE 30 MG/ML IJ SOLN: 30.0000 mg | Freq: Once | INTRAMUSCULAR | Status: DC

## 2013-08-04 MED ORDER — MIDAZOLAM HCL 2 MG/2ML IJ SOLN
INTRAMUSCULAR | Status: AC
  Filled 2013-08-04: qty 2

## 2013-08-04 MED ORDER — KETOROLAC TROMETHAMINE 30 MG/ML IJ SOLN
INTRAMUSCULAR | Status: AC
  Administered 2013-08-04: 30 mg
  Filled 2013-08-04: qty 1

## 2013-08-04 MED ORDER — LACTATED RINGERS IR SOLN
Status: DC | PRN
  Administered 2013-08-04: 1000 mL

## 2013-08-04 MED ORDER — ONDANSETRON HCL 4 MG/2ML IJ SOLN
4.0000 mg | Freq: Four times a day (QID) | INTRAMUSCULAR | Status: DC | PRN
  Administered 2013-08-04: 4 mg via INTRAVENOUS
  Filled 2013-08-04: qty 2

## 2013-08-04 SURGICAL SUPPLY — 61 items
BENZOIN TINCTURE PRP APPL 2/3 (GAUZE/BANDAGES/DRESSINGS) ×4 IMPLANT
CATH FOLEY 3WAY  5CC 16FR (CATHETERS) ×2
CATH FOLEY 3WAY 5CC 16FR (CATHETERS) ×2 IMPLANT
CHLORAPREP W/TINT 26ML (MISCELLANEOUS) ×4 IMPLANT
CLOSURE STERI-STRIP 1/4X4 (GAUZE/BANDAGES/DRESSINGS) ×4 IMPLANT
CLOSURE WOUND 1/2 X4 (GAUZE/BANDAGES/DRESSINGS)
CORD HIGH FREQUENCY UNIPOLAR (ELECTROSURGICAL) IMPLANT
CORDS BIPOLAR (ELECTRODE) ×4 IMPLANT
COVER MAYO STAND STRL (DRAPES) ×4 IMPLANT
COVER SURGICAL LIGHT HANDLE (MISCELLANEOUS) ×4 IMPLANT
COVER TIP SHEARS 8 DVNC (MISCELLANEOUS) ×2 IMPLANT
COVER TIP SHEARS 8MM DA VINCI (MISCELLANEOUS) ×2
DECANTER SPIKE VIAL GLASS SM (MISCELLANEOUS) ×4 IMPLANT
DRAPE LG THREE QUARTER DISP (DRAPES) ×8 IMPLANT
DRAPE SURG IRRIG POUCH 19X23 (DRAPES) ×4 IMPLANT
DRAPE TABLE BACK 44X90 PK DISP (DRAPES) ×8 IMPLANT
DRAPE UTILITY XL STRL (DRAPES) ×4 IMPLANT
DRAPE WARM FLUID 44X44 (DRAPE) ×4 IMPLANT
DRSG TEGADERM 2-3/8X2-3/4 SM (GAUZE/BANDAGES/DRESSINGS) ×4 IMPLANT
DRSG TEGADERM 4X4.75 (GAUZE/BANDAGES/DRESSINGS) ×4 IMPLANT
DRSG TEGADERM 6X8 (GAUZE/BANDAGES/DRESSINGS) ×8 IMPLANT
ELECT REM PT RETURN 9FT ADLT (ELECTROSURGICAL) ×4
ELECTRODE REM PT RTRN 9FT ADLT (ELECTROSURGICAL) ×2 IMPLANT
GAUZE SPONGE 2X2 8PLY STRL LF (GAUZE/BANDAGES/DRESSINGS) ×2 IMPLANT
GLOVE BIO SURGEON STRL SZ 6.5 (GLOVE) ×9 IMPLANT
GLOVE BIO SURGEON STRL SZ8 (GLOVE) ×8 IMPLANT
GLOVE BIO SURGEONS STRL SZ 6.5 (GLOVE) ×3
GOWN STRL REUS W/ TWL XL LVL3 (GOWN DISPOSABLE) ×6 IMPLANT
GOWN STRL REUS W/TWL LRG LVL3 (GOWN DISPOSABLE) ×8 IMPLANT
GOWN STRL REUS W/TWL XL LVL3 (GOWN DISPOSABLE) ×6
HOLDER FOLEY CATH W/STRAP (MISCELLANEOUS) IMPLANT
KIT ACCESSORY DA VINCI DISP (KITS) ×2
KIT ACCESSORY DVNC DISP (KITS) ×2 IMPLANT
MANIPULATOR UTERINE 4.5 ZUMI (MISCELLANEOUS) ×4 IMPLANT
OCCLUDER COLPOPNEUMO (BALLOONS) ×4 IMPLANT
PEN SKIN MARKING BROAD (MISCELLANEOUS) ×4 IMPLANT
PLUG CATH AND CAP STER (CATHETERS) ×4 IMPLANT
POUCH SPECIMEN RETRIEVAL 10MM (ENDOMECHANICALS) IMPLANT
SET TUBE IRRIG SUCTION NO TIP (IRRIGATION / IRRIGATOR) ×4 IMPLANT
SHEET LAVH (DRAPES) ×4 IMPLANT
SOLUTION ELECTROLUBE (MISCELLANEOUS) ×4 IMPLANT
SPONGE GAUZE 2X2 STER 10/PKG (GAUZE/BANDAGES/DRESSINGS) ×2
SPONGE LAP 18X18 X RAY DECT (DISPOSABLE) IMPLANT
STRIP CLOSURE SKIN 1/2X4 (GAUZE/BANDAGES/DRESSINGS) IMPLANT
SUT VIC AB 0 CT1 27 (SUTURE) ×6
SUT VIC AB 0 CT1 27XBRD ANTBC (SUTURE) ×6 IMPLANT
SUT VIC AB 4-0 PS2 27 (SUTURE) ×8 IMPLANT
SUT VICRYL 0 UR6 27IN ABS (SUTURE) ×4 IMPLANT
SYR 50ML LL SCALE MARK (SYRINGE) ×4 IMPLANT
SYR BULB IRRIGATION 50ML (SYRINGE) IMPLANT
SYRINGE 10CC LL (SYRINGE) ×4 IMPLANT
TOWEL OR 17X26 10 PK STRL BLUE (TOWEL DISPOSABLE) ×8 IMPLANT
TOWEL OR NON WOVEN STRL DISP B (DISPOSABLE) ×4 IMPLANT
TRAP SPECIMEN MUCOUS 40CC (MISCELLANEOUS) ×4 IMPLANT
TRAY FOLEY CATH 14FRSI W/METER (CATHETERS) ×4 IMPLANT
TRAY LAP CHOLE (CUSTOM PROCEDURE TRAY) ×4 IMPLANT
TROCAR 12M 150ML BLUNT (TROCAR) ×4 IMPLANT
TROCAR XCEL 12X100 BLDLESS (ENDOMECHANICALS) ×4 IMPLANT
TROCAR XCEL NON-BLD 5MMX100MML (ENDOMECHANICALS) ×8 IMPLANT
TUBING INSUFFLATION 10FT LAP (TUBING) ×4 IMPLANT
WATER STERILE IRR 1500ML POUR (IV SOLUTION) ×8 IMPLANT

## 2013-08-04 NOTE — Interval H&P Note (Signed)
History and Physical Interval Note:  08/04/2013 7:25 AM  [name] [name]  has presented today for surgery, with the diagnosis of Abnormal Uterine Bleeding  The various methods of treatment have been discussed with the patient and family. After consideration of risks, benefits and other options for treatment, the patient has consented to  Procedure(s): ROBOTIC ASSISTED TOTAL HYSTERECTOMY (N/A) BILATERAL SALPINGECTOMY (Bilateral) as a surgical intervention .  The patient's history has been reviewed, patient examined, no change in status, stable for surgery.  I have reviewed the patient's chart and labs.  Questions were answered to the patient's satisfaction.     JACKSON-MOORE,Shimshon Narula A

## 2013-08-04 NOTE — Anesthesia Postprocedure Evaluation (Signed)
Anesthesia Post Note  Patient: [name] [name]  Procedure(s) Performed: Procedure(s) (LRB): ROBOTIC ASSISTED TOTAL HYSTERECTOMY (N/A) BILATERAL SALPINGECTOMY (Bilateral)  Anesthesia type: General  Patient location: PACU  Post pain: Pain level controlled  Post assessment: Post-op Vital signs reviewed  Last Vitals:  Filed Vitals:   08/04/13 1300  BP: 106/64  Pulse: 69  Temp: 36.6 C  Resp: 18    Post vital signs: Reviewed  Level of consciousness: sedated  Complications: No apparent anesthesia complications

## 2013-08-04 NOTE — Transfer of Care (Signed)
Immediate Anesthesia Transfer of Care Note  Patient: [name] [name]  Procedure(s) Performed: Procedure(s): ROBOTIC ASSISTED TOTAL HYSTERECTOMY (N/A) BILATERAL SALPINGECTOMY (Bilateral)  Patient Location: PACU  Anesthesia Type:General  Level of Consciousness: awake, oriented, patient cooperative, lethargic and responds to stimulation  Airway & Oxygen Therapy: Patient Spontanous Breathing and Patient connected to face mask oxygen  Post-op Assessment: Report given to PACU RN, Post -op Vital signs reviewed and stable and Patient moving all extremities  Post vital signs: Reviewed and stable  Complications: No apparent anesthesia complications

## 2013-08-04 NOTE — Anesthesia Procedure Notes (Signed)
Procedure Name: Intubation Date/Time: 08/04/2013 7:42 AM Performed by: Edison PaceGRAY, [name] [name] Pre-anesthesia Checklist: Emergency Drugs available, Patient identified, Suction available, Timeout performed and Patient being monitored Patient Re-evaluated:Patient Re-evaluated prior to inductionOxygen Delivery Method: Circle system utilized Preoxygenation: Pre-oxygenation with 100% oxygen Intubation Type: IV induction Ventilation: Mask ventilation without difficulty and Mask ventilation throughout procedure Laryngoscope Size: Miller and 2 Grade View: Grade II Tube type: Oral Tube size: 7.5 mm Number of attempts: 1 Airway Equipment and Method: Stylet Placement Confirmation: ETT inserted through vocal cords under direct vision,  positive ETCO2,  CO2 detector and breath sounds checked- equal and bilateral Secured at: 21 cm Tube secured with: Tape Dental Injury: Teeth and Oropharynx as per pre-operative assessment

## 2013-08-04 NOTE — Op Note (Signed)
Pre-operative Diagnosis: abnormal uterine bleeding  Post-operative Diagnosis: abnormal uterine bleeding  Operation: Robotic-assisted hysterectomy with bilateral salpingectomy  Surgeon: Roseanna RainbowJACKSON-MOORE,Jovon Winterhalter A  Assistant: Coral Ceoharles Harper, MD  Anesthesia: GET  Urine Output: per Anesthesiology  Findings: Normal ovaries, uterus.  The mid isthmic portion of the tubes were attenuated.  Estimated Blood Loss:  less than 100 mL                 Total IV Fluids: per Anesthesiology         Specimens: PATHOLOGY               Complications:  None; patient tolerated the procedure well.         Disposition: PACU - hemodynamically stable.         Condition: Stable    Procedure Details  The patient was seen in the Holding Room. The risks, benefits, complications, treatment options, and expected outcomes were discussed with the patient.  The patient concurred with the proposed plan, giving informed consent.  The site of surgery properly noted/marked. The patient was identified as [name] [name] and the procedure verified as a Robotic-assisted hysterectomy with bilateral salpingectomy. A Time Out was held and the above information confirmed.  After induction of anesthesia, the patient was draped and prepped in the usual sterile manner. Pt was placed in supine position after anesthesia and draped and prepped in the usual sterile manner. The abdominal drape was placed after the CholoraPrep had been allowed to dry for 3 minutes.  Her arms were tucked to her side with all appropriate precautions.  The shoulder blocks were placed in the usual fashion.  The patient was placed in the semi-lithotomy position in VanceAllen stirrups.  The perineum was prepped with Betadine.  Foley catheter was placed.  A sterile speculum was placed in the vagina.  The cervix was grasped with a single-tooth tenaculum and dilated with Shawnie PonsPratt dilators.  The ZUMI uterine manipulator with a medium colpotomizer ring was placed without  difficulty.  A pneum occluder balloon was placed over the manipulator.  A second time-out was performed.  OG tube placement was confirmed and to suction.  Approximately 2 cm below the costal margin, in the midclavicular line the skin was anesthestized with 0.25% Marcaine.  A 5 mm incision was made and using a 5 mm Optiview, a 5 mm trocar was placed under direct vision.  The patient's abdomen was insufflated with CO2 gas.  At this point and all points during the procedure, the patient's intra-abdominal pressure did not exceed 15 mmHg.  A 10-12 camera port was place 24 cm above the pubic symphysis.  Bilateral 8 mm ports were place 10 cm and 15 degrees inferior.  All ports were placed under direct visualization.  The 5 mm port was removed.  The incision was extended to accommodate a 10 mm trocar.  A 10 mm trocar and sleeve were advanced under direct visualization.   The robot was docked in the usual fashion.  The round ligament on the right was transected with monopolar cautery.  The anterior and posterior leaves of the broad ligament were opened.   The fallopian tube was excised using monopolar cautery and the tube was removed from the abdomen.   A window was made in broad ligament proximal to the utero ovarian ligament/proximal fallopian tube.  The right utero-ovarian ligament and proximal fallopian tube were was coagulated with bipolar cautery and transected.  The uterine vessels were skeletonized to the level of the [facility]  ring. The uterine vessels were coagulated with bipolar cautery and transected.   A C-loop was created in the usual fashion.  A similar procedure was performed on the patient's left side.  The round ligament on the left was transected with monopolar cautery.  The fallopian tube was excised using monopolar cautery and the tube was removed from the abdomen.   A window was made in broad ligament proximal to the utero ovarian ligament/proximal fallopian tube.  The left utero-ovarian ligament and proximal  fallopian tube were was coagulated with bipolar cautery and transected.  The uterine vessels were skeletonized to the level of the Koh ring. The uterine vessels were coagulated with bipolar cautery and transected.  A C-loop was created in the usual fashion.   The bladder flap was completed.  At this time, the pneum occluder balloon was insufflated and a colpotomy was performed.  The uterus, cervix, bilateral tubes and ovaries were delivered through the vagina.  All pedicles were inspected under low intraabdominal pressures and noted to be hemostatic.  The vagina cuff was closed using 0-Vicryl on a CT-1 needle in a running fashion.  The abdomen and pelvis were copiously irrigated.  The needle was removed without difficulty.  The instruments were then removed under direct visualization and the robotic ports removed.  The robot was undocked.  Deep, subcutaneous, figure-of-eight 0-Vicryl sutures on a UR-6 needle were placed in the 10-12 mm supraumbilical and infracostal incisions.  All skin incisions were closed in a subcuticular fashion using 3-0 Monocryl.  Steri-strips and Benzoin were applied.  The vagina was swabbed with minimal bleeding noted.   All instrument and needle counts were correct x  2.

## 2013-08-04 NOTE — Preoperative (Signed)
Beta Blockers   Reason not to administer Beta Blockers:Not Applicable 

## 2013-08-05 LAB — COMPREHENSIVE METABOLIC PANEL
ALT: 9 U/L (ref 0–35)
AST: 14 U/L (ref 0–37)
Albumin: 2.9 g/dL — ABNORMAL LOW (ref 3.5–5.2)
Alkaline Phosphatase: 33 U/L — ABNORMAL LOW (ref 39–117)
BUN: 10 mg/dL (ref 6–23)
CALCIUM: 8.5 mg/dL (ref 8.4–10.5)
CO2: 22 mEq/L (ref 19–32)
CREATININE: 0.99 mg/dL (ref 0.50–1.10)
Chloride: 106 mEq/L (ref 96–112)
GFR calc non Af Amer: 75 mL/min — ABNORMAL LOW (ref >90)
GFR, EST AFRICAN AMERICAN: 87 mL/min — AB (ref >90)
GLUCOSE: 134 mg/dL — AB (ref 70–99)
Potassium: 3.7 mEq/L (ref 3.7–5.3)
Sodium: 138 mEq/L (ref 137–147)
TOTAL PROTEIN: 5.3 g/dL — AB (ref 6.0–8.3)
Total Bilirubin: 0.3 mg/dL (ref 0.3–1.2)

## 2013-08-05 LAB — CBC
HEMATOCRIT: 33.3 % — AB (ref 36.0–46.0)
Hemoglobin: 11.2 g/dL — ABNORMAL LOW (ref 12.0–15.0)
MCH: 30.6 pg (ref 26.0–34.0)
MCHC: 33.6 g/dL (ref 30.0–36.0)
MCV: 91 fL (ref 78.0–100.0)
Platelets: 138 10*3/uL — ABNORMAL LOW (ref 150–400)
RBC: 3.66 MIL/uL — ABNORMAL LOW (ref 3.87–5.11)
RDW: 12.8 % (ref 11.5–15.5)
WBC: 9.3 10*3/uL (ref 4.0–10.5)

## 2013-08-05 MED ORDER — KETOROLAC TROMETHAMINE 30 MG/ML IJ SOLN
30.0000 mg | Freq: Four times a day (QID) | INTRAMUSCULAR | Status: DC
  Administered 2013-08-05: 30 mg via INTRAVENOUS
  Filled 2013-08-05 (×4): qty 1

## 2013-08-05 MED ORDER — OXYCODONE-ACETAMINOPHEN 5-325 MG PO TABS: 1.0000 | ORAL_TABLET | ORAL | Status: DC | PRN

## 2013-08-05 MED ORDER — KETOROLAC TROMETHAMINE 30 MG/ML IJ SOLN
30.0000 mg | Freq: Four times a day (QID) | INTRAMUSCULAR | Status: DC
  Filled 2013-08-05 (×4): qty 1

## 2013-08-05 NOTE — Discharge Summary (Signed)
  Physician Discharge Summary  Patient ID: [name] [name] MRN: [id number] DOB/AGE: 10-02-1980 32 y.o.  Admit date: 08/04/2013 Discharge date: 08/05/2013   Admission Diagnoses: Abnormal uterine bleeding (AUB)  Discharge Diagnoses:  Principal Problem:   Abnormal uterine bleeding (AUB)   Discharged Condition: good  Hospital Course: On 08/04/2013, the patient underwent the following: Procedure(s): ROBOTIC ASSISTED TOTAL HYSTERECTOMY BILATERAL SALPINGECTOMY.   The postoperative course was uneventful.  She was discharged to home on postoperative day 1 tolerating a regular diet.  Consults: None  Significant Diagnostic Studies: none  Treatments: surgery: see above  Discharge Exam: Blood pressure 113/74, pulse 70, temperature 97.7 F (36.5 C), temperature source Oral, resp. rate 18, height 5\' 5"  (1.651 m), weight 141 lb 2.9 oz (64.04 kg), last menstrual period 07/30/2013, SpO2 99.00%. General appearance: alert GI: soft, non-tender; bowel sounds normal; no masses,  no organomegaly Extremities: extremities normal, atraumatic, no cyanosis or edema Incision/Wound:  C/D/I  Disposition: 01-Home or Self Care      Discharge Orders   Future Orders Complete By Expires   Call MD for:  extreme fatigue  As directed    Call MD for:  persistant dizziness or light-headedness  As directed    Call MD for:  persistant nausea and vomiting  As directed    Call MD for:  redness, tenderness, or signs of infection (pain, swelling, redness, odor or green/yellow discharge around incision site)  As directed    Call MD for:  severe uncontrolled pain  As directed    Call MD for:  temperature >100.4  As directed    Diet general  As directed    Discharge wound care:  As directed    Comments:     Keep clean and dry   Driving Restrictions  As directed    Comments:     No driving while taking narcotics   Increase activity slowly  As directed    Lifting restrictions  As directed    Comments:     No  lifting > 5 lbs for 6 weeks   May shower / Bathe  As directed    Comments:     No tub baths for 6 weeks   May walk up steps  As directed    Sexual Activity Restrictions  As directed    Comments:     No intercourse for 8 weeks       Medication List         oxyCODONE-acetaminophen 5-325 MG per tablet  Commonly known as:  PERCOCET/ROXICET  Take 1-2 tablets by mouth every 4 (four) hours as needed for severe pain (moderate to severe pain (when tolerating fluids)).       Follow-up Information   Follow up with Roseanna RainbowJACKSON-MOORE,Delmi Fulfer A, MD. Schedule an appointment as soon as possible for a visit in 2 weeks. (postop f/u)    Specialty:  Obstetrics and Gynecology   Contact information:   [address] [place] [address] [phone]       Signed: Roseanna RainbowJACKSON-MOORE,Navil Kole A 08/05/2013, 12:31 PM

## 2013-08-05 NOTE — Discharge Instructions (Signed)
Robotic Assisted  Hysterectomy °Care After °Refer to this sheet in the next few weeks. These instructions provide you with information on caring for yourself after your procedure. Your caregiver may also give you more specific instructions. Your treatment has been planned according to current medical practices, but problems sometimes occur. Call your caregiver if you have any problems or questions after your procedure. °HOME CARE INSTRUCTIONS °Healing will take time. You may have discomfort, tenderness, swelling, and bruising at the surgical site for a couple of weeks. This is normal and will get better as time goes on. °· Only take over-the-counter or prescription medicines for pain, discomfort, or fever as directed by your caregiver.  °· Do not take aspirin. It can cause bleeding.  °· Do not drive when taking pain medicine.  °· Follow your caregiver's advice regarding diet, exercise, lifting, driving, and general activities.  °· Resume your usual diet as directed and allowed.  °· Get plenty of rest and sleep.  °· Do not douche, use tampons, or have sexual intercourse for at least 6 weeks, or until your caregiver gives you permission.  °· Change your bandages (dressings) as directed by your caregiver.  °· Monitor your temperature and notify your caregiver of a fever.  °· Take showers instead of baths for 2 to 3 weeks.  °· Do not drink alcohol until your caregiver gives you permission.  °· If you develop constipation, you may take a mild laxative with your caregiver's permission. Bran foods may help with constipation problems. Drinking enough fluids to keep your urine clear or pale yellow may help as well.  °· Try to have someone home with you for 1 or 2 weeks to help around the house.  °· Keep all your follow-up appointments as directed by your caregiver.  °SEEK MEDICIAL CARE IF:  °· You have swelling, redness, or increasing pain in the wound.  °· You have pus coming from the wound.  °· You notice a bad smell  coming from the wound or bandage (dressing).  °· You have swelling, redness, or pain from the intravenous (IV) site.  °· Your wound breaks open.  °· You feel dizzy or lightheaded.  °· You have pain or bleeding when you urinate.  °· You have persistent diarrhea.  °· You have persistent nausea and vomiting.  °· You have abnormal vaginal discharge.  °· You have a rash.  °· You have any type of abnormal reaction or develop an allergy to your medicine.  °· You have poor pain control with your prescribed medicine.  °SEEK IMMEDIATE MEDICIAL CARE IF:  °· You have a fever.  °· You have severe abdominal pain.  °· You have chest pain.  °· You have shortness of breath.  °· You faint.  °· You have pain, swelling, or redness of your leg.  °· You have heavy vaginal bleeding with blood clots.  °MAKE SURE YOU: °· Understand these instructions.  °· Will watch your condition.  °· Will get help right away if you are not doing well or get worse.  °Document Released: 05/07/2011 Document Reviewed: 05/04/2011 °Exit[employer]® Patient Information ©2012 [employer]. °

## 2013-08-23 ENCOUNTER — Encounter: Payer: Medicaid Other | Admitting: Obstetrics & Gynecology

## 2013-08-23 ENCOUNTER — Ambulatory Visit (INDEPENDENT_AMBULATORY_CARE_PROVIDER_SITE_OTHER): Payer: Medicaid Other | Admitting: Obstetrics & Gynecology

## 2013-08-23 VITALS — BP 134/85 | HR 66 | Temp 98.2°F | Wt 138.0 lb

## 2013-08-23 DIAGNOSIS — Z09 Encounter for follow-up examination after completed treatment for conditions other than malignant neoplasm: Secondary | ICD-10-CM

## 2013-08-23 DIAGNOSIS — IMO0002 Reserved for concepts with insufficient information to code with codable children: Secondary | ICD-10-CM

## 2013-08-23 LAB — POCT URINALYSIS DIPSTICK
Bilirubin, UA: NEGATIVE
GLUCOSE UA: NEGATIVE
Ketones, UA: NEGATIVE
Leukocytes, UA: NEGATIVE
NITRITE UA: NEGATIVE
PROTEIN UA: NEGATIVE
Spec Grav, UA: 1.02
UROBILINOGEN UA: NEGATIVE
pH, UA: 5

## 2013-08-23 NOTE — Progress Notes (Signed)
Subjective:     [name] [name] is a 33 y.o. female here for a routine exam.  Current complaints: pt is in office for f/u from hysterectomy on 08/04/13.  Pt states that she has been having pressure since surgery.  Pt states that today she started to have some bleeding.  Pt states that it is like a period.  Pt states that she is also having some cramping. Pt denies any urinary problems today.  Personal health questionnaire reviewed: yes.   Gynecologic History Patient's last menstrual period was 07/30/2013.   Obstetric History OB History  Gravida Para Term Preterm AB SAB TAB Ectopic Multiple Living  3 3 3       3     # Outcome Date GA Lbr Len/2nd Weight Sex Delivery Anes PTL Lv  3 TRM     M LTCS  N Y  2 TRM     F LTCS  N Y  1 TRM     M LTCS   Y     Comments: unable to deliver- "shoulders wouldn't pass"       The following portions of the patient's history were reviewed and updated as appropriate: allergies, current medications, past family history, past medical history, past social history, past surgical history and problem list.  Review of Systems Pertinent items are noted in HPI.    Objective:     SPEC: heme present, vaginal cuff intact, no discrete bleeding point; monsel's applied     Assessment:   S/P TRH--likely bleeding secondary to absorption of suture/inflammation, ?vaginal cuff hematoma  Plan:    Return prn or in 1 week

## 2013-08-24 ENCOUNTER — Encounter: Payer: Self-pay | Admitting: Obstetrics & Gynecology

## 2013-08-25 ENCOUNTER — Encounter: Payer: Medicaid Other | Admitting: Obstetrics & Gynecology

## 2013-08-30 ENCOUNTER — Encounter: Payer: Self-pay | Admitting: Obstetrics & Gynecology

## 2013-08-30 ENCOUNTER — Ambulatory Visit (INDEPENDENT_AMBULATORY_CARE_PROVIDER_SITE_OTHER): Payer: Medicaid Other | Admitting: Obstetrics & Gynecology

## 2013-08-30 VITALS — BP 125/89 | HR 82 | Temp 97.9°F | Ht 65.0 in | Wt 140.0 lb

## 2013-08-30 DIAGNOSIS — Z09 Encounter for follow-up examination after completed treatment for conditions other than malignant neoplasm: Secondary | ICD-10-CM

## 2013-08-30 NOTE — Progress Notes (Signed)
Subjective:     [name] [name] is a 33 y.o. female here for a routine exam.  Current complaints: post op follow up. Patient is post status hysterectomy. Patient states she has no concerns at this time.      The HPI was reviewed and explored in further detail by the provider. Gynecologic History Patient's last menstrual period was 07/30/2013. Contraception: status post hysterectomy  Obstetric History OB History  Gravida Para Term Preterm AB SAB TAB Ectopic Multiple Living  3 3 3       3     # Outcome Date GA Lbr Len/2nd Weight Sex Delivery Anes PTL Lv  3 TRM     M LTCS  N Y  2 TRM     F LTCS  N Y  1 TRM     M LTCS   Y     Comments: unable to deliver- "shoulders wouldn't pass"       The following portions of the patient's history were reviewed and updated as appropriate: allergies, current medications, past family history, past medical history, past social history, past surgical history and problem list.  Review of Systems Pertinent items are noted in HPI.    Objective:     Abd: incisions intact        Assessment:    ?vaginal cuff hematoma--no drainage at present  Plan:    Return prn in 2 weeks

## 2013-09-13 ENCOUNTER — Ambulatory Visit (INDEPENDENT_AMBULATORY_CARE_PROVIDER_SITE_OTHER): Payer: Medicaid Other | Admitting: Obstetrics & Gynecology

## 2013-09-13 ENCOUNTER — Encounter: Payer: Self-pay | Admitting: Obstetrics & Gynecology

## 2013-09-13 VITALS — BP 120/82 | HR 75 | Temp 98.1°F | Ht 65.0 in | Wt 142.0 lb

## 2013-09-13 DIAGNOSIS — Z01419 Encounter for gynecological examination (general) (routine) without abnormal findings: Secondary | ICD-10-CM

## 2013-09-13 DIAGNOSIS — Z09 Encounter for follow-up examination after completed treatment for conditions other than malignant neoplasm: Secondary | ICD-10-CM

## 2013-09-13 NOTE — Progress Notes (Signed)
Subjective:     Ericca Sadie Haber Rail is a 33 y.o. female who presents to the clinic 6 weeks status post laparoscopic assisted vaginal hysterectomy for abnormal uterine bleeding. Eating a regular diet without difficulty. Bowel movements are normal. Pain is controlled without any medications. Patient denies any concerns.   The following portions of the patient's history were reviewed and updated as appropriate: allergies, current medications, past family history, past medical history, past social history, past surgical history and problem list.  Review of Systems Pertinent items are noted in HPI.    Objective:    LMP 07/30/2013 General:  alert  Abdomen: soft, bowel sounds active, non-tender, incisions well-healed  Pelvic:   vaginal mucosa with separation in the midline     Procedure:  A time-out was performed.  A speculum was placed.  The vaginal cuff was prepped with betadine. The mucosa was infiltrated with 1% lidocaine.  Two figure of eight sutures were placed in the vaginal mucosa.  Adequate hemostasis was noted.  Assessment:    Doing well postoperatively. Superficial vaginal mucosal separation repaired today Operative findings again reviewed. Pathology report discussed.    Plan:     Continue any current medications.  Activity restrictions: no intercourse Anticipated return to work: 1-2 weeks. 5. Follow up: 4 weeks.

## 2013-09-27 NOTE — Telephone Encounter (Signed)
Error

## 2013-10-15 ENCOUNTER — Encounter: Payer: Self-pay | Admitting: Obstetrics & Gynecology

## 2013-10-15 NOTE — Progress Notes (Signed)
Patient ID: [name] [name], female   DOB: [birth year], 33 y.o.   MRN: [id number] Pt not seen.

## 2013-10-18 ENCOUNTER — Encounter: Payer: Self-pay | Admitting: Obstetrics & Gynecology

## 2013-10-18 ENCOUNTER — Ambulatory Visit (INDEPENDENT_AMBULATORY_CARE_PROVIDER_SITE_OTHER): Payer: Medicaid Other | Admitting: Obstetrics & Gynecology

## 2013-10-18 VITALS — BP 127/85 | HR 80 | Temp 98.2°F | Wt 143.0 lb

## 2013-10-18 DIAGNOSIS — Z09 Encounter for follow-up examination after completed treatment for conditions other than malignant neoplasm: Secondary | ICD-10-CM

## 2013-10-18 NOTE — Progress Notes (Signed)
Subjective:     [name] [name] is a 33 y.o. female who presents to the clinic 10 weeks status post a robotic assisted hysterectomy for abnormal uterine bleeding. Eating a regular diet without difficulty. Bowel movements are normal. Pain is controlled without any medications. C/O lower abdominal pain with urination.  The following portions of the patient's history were reviewed and updated as appropriate: allergies, current medications, past family history, past medical history, past social history, past surgical history and problem list.  Review of Systems Pertinent items are noted in HPI.    Objective:    BP 127/85  Pulse 80  Temp(Src) 98.2 F (36.8 C)  Wt 64.864 kg (143 lb)  LMP 07/30/2013 General:  alert  Abdomen: soft, bowel sounds active, non-tender, incisions well-healed  Pelvic:   vaginal cuff sutures present; minimal granulation tissue; cuff intact       Assessment:    Doing well postoperatively. ?Bladder spasm   Plan:     Continue any current medications. Urobel  Resume intercourse Follow up: 4 weeks.

## 2013-11-16 ENCOUNTER — Telehealth: Payer: Self-pay | Admitting: *Deleted

## 2013-11-16 DIAGNOSIS — N760 Acute vaginitis: Secondary | ICD-10-CM

## 2013-11-16 DIAGNOSIS — B009 Herpesviral infection, unspecified: Secondary | ICD-10-CM

## 2013-11-16 DIAGNOSIS — B9689 Other specified bacterial agents as the cause of diseases classified elsewhere: Secondary | ICD-10-CM

## 2013-11-16 MED ORDER — METRONIDAZOLE 500 MG PO TABS
500.0000 mg | ORAL_TABLET | Freq: Two times a day (BID) | ORAL | Status: DC
Start: 2013-11-16 — End: 2013-12-13

## 2013-11-16 MED ORDER — VALACYCLOVIR HCL 500 MG PO TABS: 500.0000 mg | ORAL_TABLET | Freq: Two times a day (BID) | ORAL | Status: DC

## 2013-11-16 NOTE — Telephone Encounter (Signed)
Patient called requesting Rx refill of Valtrex. Patient also states that she has resumed sexual intercourse and has BV symptoms. Patient would like treatment. Rx forwarded to pharmacy per office protocol.

## 2013-11-22 ENCOUNTER — Encounter: Payer: Medicaid Other | Admitting: Obstetrics & Gynecology

## 2013-11-23 ENCOUNTER — Encounter: Payer: Medicaid Other | Admitting: Obstetrics & Gynecology

## 2013-12-13 ENCOUNTER — Ambulatory Visit (INDEPENDENT_AMBULATORY_CARE_PROVIDER_SITE_OTHER): Payer: Medicaid Other | Admitting: Obstetrics & Gynecology

## 2013-12-13 VITALS — BP 124/83 | HR 69 | Temp 98.4°F | Wt 142.0 lb

## 2013-12-13 DIAGNOSIS — N390 Urinary tract infection, site not specified: Secondary | ICD-10-CM

## 2013-12-13 DIAGNOSIS — R5383 Other fatigue: Secondary | ICD-10-CM

## 2013-12-13 DIAGNOSIS — R5381 Other malaise: Secondary | ICD-10-CM

## 2013-12-13 LAB — COMPREHENSIVE METABOLIC PANEL
ALBUMIN: 4.4 g/dL (ref 3.5–5.2)
ALT: <8 U/L (ref 0–35)
AST: 15 U/L (ref 0–37)
Alkaline Phosphatase: 39 U/L (ref 39–117)
BUN: 16 mg/dL (ref 6–23)
CALCIUM: 9.2 mg/dL (ref 8.4–10.5)
CHLORIDE: 108 meq/L (ref 96–112)
CO2: 26 mEq/L (ref 19–32)
CREATININE: 1.07 mg/dL (ref 0.50–1.10)
GLUCOSE: 75 mg/dL (ref 70–99)
POTASSIUM: 4.4 meq/L (ref 3.5–5.3)
Sodium: 140 mEq/L (ref 135–145)
Total Bilirubin: 0.4 mg/dL (ref 0.2–1.2)
Total Protein: 6.7 g/dL (ref 6.0–8.3)

## 2013-12-13 LAB — CBC
HCT: 40 % (ref 36.0–46.0)
Hemoglobin: 13.5 g/dL (ref 12.0–15.0)
MCH: 30.5 pg (ref 26.0–34.0)
MCHC: 33.8 g/dL (ref 30.0–36.0)
MCV: 90.3 fL (ref 78.0–100.0)
Platelets: 234 10*3/uL (ref 150–400)
RBC: 4.43 MIL/uL (ref 3.87–5.11)
RDW: 13.9 % (ref 11.5–15.5)
WBC: 6.4 10*3/uL (ref 4.0–10.5)

## 2013-12-13 MED ORDER — URIBEL 118 MG PO CAPS: 1.0000 | ORAL_CAPSULE | Freq: Every day | ORAL | Status: DC

## 2013-12-14 LAB — URINALYSIS, ROUTINE W REFLEX MICROSCOPIC
Bilirubin Urine: NEGATIVE
GLUCOSE, UA: NEGATIVE mg/dL
Hgb urine dipstick: NEGATIVE
KETONES UR: NEGATIVE mg/dL
Leukocytes, UA: NEGATIVE
Nitrite: NEGATIVE
Protein, ur: NEGATIVE mg/dL
SPECIFIC GRAVITY, URINE: 1.03 (ref 1.005–1.030)
Urobilinogen, UA: 0.2 mg/dL (ref 0.0–1.0)
pH: 5 (ref 5.0–8.0)

## 2013-12-15 ENCOUNTER — Encounter: Payer: Self-pay | Admitting: Obstetrics & Gynecology

## 2013-12-15 LAB — URINE CULTURE
Colony Count: NO GROWTH
ORGANISM ID, BACTERIA: NO GROWTH

## 2013-12-15 NOTE — Progress Notes (Signed)
Subjective:     [name] [name] is a 33 y.o. female who presents to the clinic several months status post a robotic assisted hysterectomy for abnormal uterine bleeding. Eating a regular diet without difficulty. Bowel movements are normal. C/O lower abdominal pain with urination, dyspareunia  The following portions of the patient's history were reviewed and updated as appropriate: allergies, current medications, past family history, past medical history, past social history, past surgical history and problem list.  Review of Systems Pertinent items are noted in HPI.    Objective:    BP 124/83  Pulse 69  Temp(Src) 98.4 F (36.9 C)  Wt 64.411 kg (142 lb)  LMP 07/30/2013 General:  alert  Abdomen: soft, bowel sounds active, non-tender, incisions well-healed  Pelvic:  ;cuff intact, tenderness to palpation, no masses,       Assessment:    Doing well postoperatively. 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

## 2014-01-24 ENCOUNTER — Ambulatory Visit: Payer: Medicaid Other | Admitting: Obstetrics & Gynecology

## 2014-02-21 ENCOUNTER — Ambulatory Visit: Payer: Medicaid Other | Admitting: Obstetrics & Gynecology

## 2014-02-22 ENCOUNTER — Ambulatory Visit (INDEPENDENT_AMBULATORY_CARE_PROVIDER_SITE_OTHER): Payer: Medicaid Other | Admitting: Obstetrics & Gynecology

## 2014-03-01 ENCOUNTER — Ambulatory Visit: Payer: Medicaid Other | Admitting: Obstetrics & Gynecology

## 2014-03-07 ENCOUNTER — Telehealth: Payer: Self-pay | Admitting: Obstetrics & Gynecology

## 2014-03-13 NOTE — Telephone Encounter (Signed)
10.13.2015 - task completed. brm °

## 2014-03-14 ENCOUNTER — Ambulatory Visit: Payer: Medicaid Other | Admitting: Obstetrics & Gynecology

## 2014-03-22 ENCOUNTER — Encounter: Payer: Medicaid Other | Admitting: Obstetrics & Gynecology

## 2014-03-30 ENCOUNTER — Telehealth: Payer: Self-pay | Admitting: *Deleted

## 2014-03-30 NOTE — Telephone Encounter (Signed)
Patient is requesting refills on Valtrex and Flagyl. Please advise.

## 2014-04-01 NOTE — Telephone Encounter (Signed)
OK to refill Valtrex

## 2014-04-02 ENCOUNTER — Encounter: Payer: Self-pay | Admitting: Obstetrics & Gynecology

## 2014-04-03 ENCOUNTER — Other Ambulatory Visit: Payer: Self-pay | Admitting: *Deleted

## 2014-04-03 DIAGNOSIS — B009 Herpesviral infection, unspecified: Secondary | ICD-10-CM

## 2014-04-03 MED ORDER — VALACYCLOVIR HCL 500 MG PO TABS: 500.0000 mg | ORAL_TABLET | Freq: Two times a day (BID) | ORAL | Status: DC

## 2014-04-03 NOTE — Telephone Encounter (Signed)
Please call patient- let her know that the provider filled her Valtrex, but if she needs the antibiotic, she will need an appointment- please offer to make her one.

## 2014-04-04 ENCOUNTER — Ambulatory Visit: Payer: Medicaid Other | Admitting: Obstetrics & Gynecology

## 2014-04-27 ENCOUNTER — Telehealth: Payer: Self-pay | Admitting: *Deleted

## 2014-04-27 DIAGNOSIS — N760 Acute vaginitis: Principal | ICD-10-CM

## 2014-04-27 DIAGNOSIS — B9689 Other specified bacterial agents as the cause of diseases classified elsewhere: Secondary | ICD-10-CM

## 2014-04-27 MED ORDER — METRONIDAZOLE 0.75 % VA GEL
1.0000 | Freq: Every day | VAGINAL | Status: DC
Start: 2014-04-27 — End: 2021-05-07

## 2014-04-27 NOTE — Telephone Encounter (Signed)
Patient states she is having vaginal discharge with odor. Requesting a prescription for Metrogel. Per nursing protocol prescription sent to the pharmacy and patient aware.

## 2014-05-14 ENCOUNTER — Encounter: Payer: Medicaid Other | Admitting: Obstetrics & Gynecology

## 2014-05-28 ENCOUNTER — Encounter: Payer: Medicaid Other | Admitting: Obstetrics & Gynecology

## 2014-05-28 ENCOUNTER — Encounter: Payer: Self-pay | Admitting: *Deleted

## 2014-05-29 ENCOUNTER — Encounter: Payer: Self-pay | Admitting: Obstetrics & Gynecology

## 2014-11-28 NOTE — Telephone Encounter (Signed)
CLOSE ENCOUNTER °

## 2015-02-12 ENCOUNTER — Ambulatory Visit: Payer: Medicaid Other | Admitting: Certified Nurse Midwife

## 2015-03-14 ENCOUNTER — Ambulatory Visit: Payer: Medicaid Other | Admitting: Certified Nurse Midwife

## 2015-07-24 ENCOUNTER — Ambulatory Visit: Payer: Medicaid Other | Admitting: Certified Nurse Midwife

## 2015-07-30 ENCOUNTER — Ambulatory Visit: Payer: Medicaid Other | Admitting: Certified Nurse Midwife

## 2015-10-24 ENCOUNTER — Ambulatory Visit: Payer: Medicaid Other | Admitting: Certified Nurse Midwife

## 2016-04-30 ENCOUNTER — Ambulatory Visit: Payer: Medicaid Other | Admitting: Obstetrics & Gynecology

## 2016-06-22 ENCOUNTER — Encounter (HOSPITAL_COMMUNITY): Payer: Self-pay | Admitting: Emergency Medicine

## 2016-06-22 ENCOUNTER — Emergency Department (HOSPITAL_COMMUNITY)
Admission: EM | Admit: 2016-06-22 | Discharge: 2016-06-22 | Disposition: A | Discharge status: Home / Self Care | Payer: No Typology Code available for payment source | Attending: Emergency Medicine | Admitting: Emergency Medicine

## 2016-06-22 DIAGNOSIS — M545 Low back pain: Secondary | ICD-10-CM | POA: Insufficient documentation

## 2016-06-22 DIAGNOSIS — M542 Cervicalgia: Secondary | ICD-10-CM | POA: Insufficient documentation

## 2016-06-22 DIAGNOSIS — Y92410 Unspecified street and highway as the place of occurrence of the external cause: Secondary | ICD-10-CM | POA: Insufficient documentation

## 2016-06-22 DIAGNOSIS — Y999 Unspecified external cause status: Secondary | ICD-10-CM | POA: Diagnosis not present

## 2016-06-22 DIAGNOSIS — Z5321 Procedure and treatment not carried out due to patient leaving prior to being seen by health care provider: Secondary | ICD-10-CM | POA: Diagnosis not present

## 2016-06-22 DIAGNOSIS — Y939 Activity, unspecified: Secondary | ICD-10-CM | POA: Insufficient documentation

## 2016-06-22 LAB — URINALYSIS, ROUTINE W REFLEX MICROSCOPIC
Bilirubin Urine: NEGATIVE
Glucose, UA: NEGATIVE mg/dL
Hgb urine dipstick: NEGATIVE
Ketones, ur: NEGATIVE mg/dL
LEUKOCYTES UA: NEGATIVE
Nitrite: NEGATIVE
PROTEIN: NEGATIVE mg/dL
Specific Gravity, Urine: 1.014 (ref 1.005–1.030)
pH: 5 (ref 5.0–8.0)

## 2016-06-22 LAB — COMPREHENSIVE METABOLIC PANEL
ALT: 13 U/L — AB (ref 14–54)
ANION GAP: 5 (ref 5–15)
AST: 16 U/L (ref 15–41)
Albumin: 4 g/dL (ref 3.5–5.0)
Alkaline Phosphatase: 37 U/L — ABNORMAL LOW (ref 38–126)
BUN: 24 mg/dL — ABNORMAL HIGH (ref 6–20)
CHLORIDE: 108 mmol/L (ref 101–111)
CO2: 25 mmol/L (ref 22–32)
Calcium: 9 mg/dL (ref 8.9–10.3)
Creatinine, Ser: 1.16 mg/dL — ABNORMAL HIGH (ref 0.44–1.00)
Glucose, Bld: 97 mg/dL (ref 65–99)
POTASSIUM: 4.4 mmol/L (ref 3.5–5.1)
SODIUM: 138 mmol/L (ref 135–145)
Total Bilirubin: 0.5 mg/dL (ref 0.3–1.2)
Total Protein: 6.7 g/dL (ref 6.5–8.1)

## 2016-06-22 LAB — CBC
HEMATOCRIT: 38.4 % (ref 36.0–46.0)
Hemoglobin: 12.8 g/dL (ref 12.0–15.0)
MCH: 30.1 pg (ref 26.0–34.0)
MCHC: 33.3 g/dL (ref 30.0–36.0)
MCV: 90.4 fL (ref 78.0–100.0)
PLATELETS: 220 10*3/uL (ref 150–400)
RBC: 4.25 MIL/uL (ref 3.87–5.11)
RDW: 13 % (ref 11.5–15.5)
WBC: 8.1 10*3/uL (ref 4.0–10.5)
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
## 2016-06-22 NOTE — ED Notes (Signed)
Pt left. 

## 2016-06-22 NOTE — ED Notes (Signed)
Called for room placement no response. 

## 2016-12-28 ENCOUNTER — Ambulatory Visit: Payer: Self-pay | Admitting: Obstetrics and Gynecology

## 2018-09-01 ENCOUNTER — Ambulatory Visit (INDEPENDENT_AMBULATORY_CARE_PROVIDER_SITE_OTHER): Payer: Medicaid Other | Admitting: Certified Nurse Midwife

## 2018-09-01 ENCOUNTER — Other Ambulatory Visit: Payer: Self-pay

## 2018-09-01 ENCOUNTER — Encounter: Payer: Self-pay | Admitting: Certified Nurse Midwife

## 2018-09-01 ENCOUNTER — Other Ambulatory Visit: Payer: Self-pay | Admitting: Certified Nurse Midwife

## 2018-09-01 DIAGNOSIS — B9689 Other specified bacterial agents as the cause of diseases classified elsewhere: Secondary | ICD-10-CM

## 2018-09-01 DIAGNOSIS — N63 Unspecified lump in unspecified breast: Secondary | ICD-10-CM

## 2018-09-01 DIAGNOSIS — N6341 Unspecified lump in right breast, subareolar: Secondary | ICD-10-CM | POA: Diagnosis not present

## 2018-09-01 DIAGNOSIS — N760 Acute vaginitis: Secondary | ICD-10-CM | POA: Diagnosis not present

## 2018-09-01 MED ORDER — TINIDAZOLE 500 MG PO TABS: 2.0000 g | ORAL_TABLET | Freq: Every day | ORAL | 0 refills | Status: DC

## 2018-09-01 NOTE — Progress Notes (Signed)
TELEHEALTH VIRTUAL GYNECOLOGY VISIT ENCOUNTER NOTE  I connected with [name] [name] on 09/01/18 at  3:15 PM EDT by telephone at home and verified that I am speaking with the correct person using two identifiers.   I discussed the limitations, risks, security and privacy concerns of performing an evaluation and management service by telephone and the availability of in person appointments. I also discussed with the patient that there may be a patient responsible charge related to this service. The patient expressed understanding and agreed to proceed.   History:  [name] [name] is a 38 y.o. [id number] female being evaluated today for pain in right breast and vaginal discharge. She denies any abnormal vaginal bleeding, pelvic pain or other concerns.       Past Medical History:  Diagnosis Date  . Asthma    childhood - no problems as adult, no inhaler  . History of blood transfusion 2005   [facility] with c/s surgery  . Seasonal allergies    Past Surgical History:  Procedure Laterality Date  . BILATERAL SALPINGECTOMY Bilateral 08/04/2013   Procedure: BILATERAL SALPINGECTOMY;  Surgeon: Antionette Char, MD;  Location: [facility];  Service: Gynecology;  Laterality: Bilateral;  . CESAREAN SECTION     x 3  . DILITATION & CURRETTAGE/HYSTROSCOPY WITH NOVASURE ABLATION N/A 03/31/2013   Procedure: DILATATION & CURETTAGE/HYSTEROSCOPY WITH Attempted NOVASURE ABLATION;  Surgeon: Antionette Char, MD;  Location: [facility];  Service: Gynecology;  Laterality: N/A;  . ROBOTIC ASSISTED TOTAL HYSTERECTOMY N/A 08/04/2013   Procedure: ROBOTIC ASSISTED TOTAL HYSTERECTOMY;  Surgeon: Antionette Char, MD;  Location: [facility];  Service: Gynecology;  Laterality: N/A;  . TUBAL LIGATION    . WISDOM TOOTH EXTRACTION     The following portions of the patient's history were reviewed and updated as appropriate: allergies, current medications, past family history, past medical history, past social history, past surgical  history and problem list.   Health Maintenance:  She has never had mammogram per patient.   Review of Systems:  Pertinent items noted in HPI and remainder of comprehensive ROS otherwise negative.  Physical Exam:  Physical exam deferred due to nature of the encounter  Labs and Imaging No results found for this or any previous visit (from the past 336 hour(s)). No results found.    Assessment and Plan:     1. Bacterial vaginosis - Reports since COVID19 has changed soap due to normal soap being out of stock, since change has been experiencing white thin discharge with mild odor - Based on clinical symptoms and recent change in soap most likely bacterial vaginosis, will send in treatment  - tinidazole (TINDAMAX) 500 MG tablet; Take 4 tablets (2,000 mg total) by mouth daily with breakfast. For two days  Dispense: 8 tablet; Refill: 0  2. Breast lump or mass - Reports for the past 2 weeks have been noticing pain in right breast, performed self breast examination and noted 2cm lump behind nipple  - Tender to touch, mobile and regular in shape  - MM DIAG BREAST TOMO BILATERAL; Future     I discussed the assessment and treatment plan with the patient. The patient was provided an opportunity to ask questions and all were answered. The patient agreed with the plan and demonstrated an understanding of the instructions.   The patient was advised to call back or seek an in-person evaluation/go to the ED if the symptoms worsen or if the condition fails to improve as anticipated.  I provided 15 minutes of non-face-to-face  time during this encounter.   Sharyon Cable, CNM [facility] [employer], [facility]

## 2018-09-02 ENCOUNTER — Other Ambulatory Visit: Payer: Self-pay | Admitting: Certified Nurse Midwife

## 2018-09-16 ENCOUNTER — Ambulatory Visit
Admission: RE | Admit: 2018-09-16 | Discharge: 2018-09-16 | Disposition: A | Discharge status: Home / Self Care | Payer: Medicaid Other | Source: Ambulatory Visit | Attending: Certified Nurse Midwife | Admitting: Certified Nurse Midwife

## 2018-09-16 ENCOUNTER — Ambulatory Visit: Payer: Medicaid Other

## 2018-09-16 ENCOUNTER — Other Ambulatory Visit: Payer: Self-pay

## 2018-09-16 DIAGNOSIS — N63 Unspecified lump in unspecified breast: Secondary | ICD-10-CM

## 2019-02-20 ENCOUNTER — Other Ambulatory Visit: Payer: Self-pay

## 2019-02-20 ENCOUNTER — Ambulatory Visit (INDEPENDENT_AMBULATORY_CARE_PROVIDER_SITE_OTHER): Payer: Medicaid Other

## 2019-02-20 ENCOUNTER — Ambulatory Visit: Payer: Medicaid Other | Admitting: Podiatry

## 2019-02-20 ENCOUNTER — Other Ambulatory Visit: Payer: Self-pay | Admitting: Podiatry

## 2019-02-20 VITALS — Temp 98.2°F

## 2019-02-20 DIAGNOSIS — M7752 Other enthesopathy of left foot: Secondary | ICD-10-CM

## 2019-02-20 DIAGNOSIS — M79671 Pain in right foot: Secondary | ICD-10-CM

## 2019-02-20 DIAGNOSIS — M778 Other enthesopathies, not elsewhere classified: Secondary | ICD-10-CM

## 2019-02-20 DIAGNOSIS — G5791 Unspecified mononeuropathy of right lower limb: Secondary | ICD-10-CM

## 2019-02-20 DIAGNOSIS — M7751 Other enthesopathy of right foot: Secondary | ICD-10-CM

## 2019-02-20 DIAGNOSIS — M79672 Pain in left foot: Secondary | ICD-10-CM

## 2019-02-20 MED ORDER — METHYLPREDNISOLONE 4 MG PO TBPK: ORAL_TABLET | ORAL | 0 refills | Status: DC

## 2019-02-20 MED ORDER — DICLOFENAC SODIUM 75 MG PO TBEC: 75.0000 mg | DELAYED_RELEASE_TABLET | Freq: Two times a day (BID) | ORAL | 1 refills | Status: DC

## 2019-02-25 NOTE — Progress Notes (Signed)
   HPI: 38 y.o. female presenting today as a new patient with a chief complaint of right dorsal foot and posterior heel pain that began about three months ago. She also reports left medial foot pain that also began three months ago. She reports associated numbness of the 2nd and 3rd digits of the bilateral feet that has been ongoing for the past 2 years. Walking sometimes increases the pain. She has not had any treatment for her symptoms. Patient is here for further evaluation and treatment.   Past Medical History:  Diagnosis Date  . Asthma    childhood - no problems as adult, no inhaler  . History of blood transfusion 2005   [facility] with c/s surgery  . Seasonal allergies      Physical Exam: General: The patient is alert and oriented x3 in no acute distress.  Dermatology: Skin is warm, dry and supple bilateral lower extremities. Negative for open lesions or macerations.  Vascular: Palpable pedal pulses bilaterally. No edema or erythema noted. Capillary refill within normal limits.  Neurological: Positive Tinel sign noted with percussion of right dorsal foot. Epicritic and protective threshold grossly intact bilaterally.   Musculoskeletal Exam: Pain with palpation noted to the bilateral midfoot. Range of motion within normal limits to all pedal and ankle joints bilateral. Muscle strength 5/5 in all groups bilateral.   Radiographic Exam:  Normal osseous mineralization. Joint spaces preserved. No fracture/dislocation/boney destruction.    Assessment: 1. Midfoot capsulitis bilateral  2. Neuritis right dorsal foot   Plan of Care:  1. Patient evaluated. X-Rays reviewed.  2. Declined injections.  3. Prescription for Medrol Dose Pak provided to patient. 4. Prescription for Diclofenac provided to patient. 5. Recommended OTC Powerstep insoles.  6. Return to clinic as needed.       Edrick Kins, DPM [facility]  Dr. Edrick Kins, DPM    [address], [place] [address]                Office [phone]  Fax [phone]

## 2019-03-15 ENCOUNTER — Other Ambulatory Visit: Payer: Self-pay | Admitting: *Deleted

## 2019-03-15 ENCOUNTER — Telehealth: Payer: Self-pay | Admitting: *Deleted

## 2019-03-15 MED ORDER — DICLOFENAC SODIUM 75 MG PO TBEC: 75.0000 mg | DELAYED_RELEASE_TABLET | Freq: Two times a day (BID) | ORAL | 1 refills | Status: DC

## 2019-03-15 MED ORDER — MELOXICAM 15 MG PO TABS: 15.0000 mg | ORAL_TABLET | Freq: Every day | ORAL | 0 refills | Status: DC

## 2019-03-15 NOTE — Addendum Note (Signed)
Addended by: Cranford Mon R on: 03/15/2019 10:25 AM   Modules accepted: Orders

## 2019-03-15 NOTE — Telephone Encounter (Signed)
[facility] on [address] sent over a prior-authorization for Diclofenac Sodium 75 mg tablet and per Dr Amalia Hailey switched from Diclofenac Sodium 75 mg to Meloxicam 15 mg, 30 tablets with no refills and I called the patient to let patient know that we had switched and the prescription was sent over. Lattie Haw

## 2019-03-15 NOTE — Addendum Note (Signed)
Addended by: Cranford Mon R on: 03/15/2019 10:36 AM   Modules accepted: Orders

## 2019-03-15 NOTE — Telephone Encounter (Signed)
[facility] on [address] faxed over a Prescription request for 90 day supply of meloxicam 15 mg, 30 tablets and no refills and per Dr Amalia Hailey was ok to do the 90 day refill. Lattie Haw

## 2019-08-24 ENCOUNTER — Ambulatory Visit: Payer: Medicaid Other | Attending: Internal Medicine

## 2019-08-24 DIAGNOSIS — Z23 Encounter for immunization: Secondary | ICD-10-CM

## 2019-08-24 NOTE — Progress Notes (Signed)
   Covid-19 Vaccination Clinic  Name:  EVANY SCHECTER    MRN: [id number] DOB: Feb 08, 1981  08/24/2019  Ms. Sandoval was observed post Covid-19 immunization for 15 minutes without incident. She was provided with Vaccine Information Sheet and instruction to access the V-Safe system.   Ms. Amparo was instructed to call 911 with any severe reactions post vaccine: Marland Kitchen Difficulty breathing  . Swelling of face and throat  . A fast heartbeat  . A bad rash all over body  . Dizziness and weakness   Immunizations Administered    Name Date Dose VIS Date Route   Pfizer COVID-19 Vaccine 08/24/2019  2:22 PM 0.3 mL 05/12/2019 Intramuscular   Manufacturer: [employer], [employer]   Lot: [id number]   NDC: [address]-7

## 2019-09-18 ENCOUNTER — Ambulatory Visit: Payer: Medicaid Other | Attending: Internal Medicine

## 2019-09-18 DIAGNOSIS — Z23 Encounter for immunization: Secondary | ICD-10-CM

## 2019-09-18 NOTE — Progress Notes (Signed)
   Covid-19 Vaccination Clinic  Name:  [name] [name]    MRN: [id number] DOB: [birth year]  09/18/2019  [name] was observed post Covid-19 immunization for 15 minutes without incident. She was provided with Vaccine Information Sheet and instruction to access the V-Safe system.   [name] was instructed to call 911 with any severe reactions post vaccine: Marland Kitchen Difficulty breathing  . Swelling of face and throat  . A fast heartbeat  . A bad rash all over body  . Dizziness and weakness   Immunizations Administered    Name Date Dose VIS Date Route   Pfizer COVID-19 Vaccine 09/18/2019  1:52 PM 0.3 mL 07/26/2018 Intramuscular   Manufacturer: [employer], [employer]   Lot: [id number]   NDC: [address]-8

## 2019-12-20 ENCOUNTER — Ambulatory Visit: Payer: Medicaid Other | Admitting: Obstetrics

## 2020-01-01 ENCOUNTER — Other Ambulatory Visit (HOSPITAL_COMMUNITY)
Admission: RE | Admit: 2020-01-01 | Discharge: 2020-01-01 | Disposition: A | Discharge status: Home / Self Care | Payer: Medicaid Other | Source: Ambulatory Visit | Attending: Obstetrics | Admitting: Obstetrics

## 2020-01-01 ENCOUNTER — Other Ambulatory Visit: Payer: Self-pay

## 2020-01-01 ENCOUNTER — Encounter: Payer: Self-pay | Admitting: Obstetrics

## 2020-01-01 ENCOUNTER — Ambulatory Visit (INDEPENDENT_AMBULATORY_CARE_PROVIDER_SITE_OTHER): Payer: Medicaid Other | Admitting: Obstetrics

## 2020-01-01 VITALS — BP 132/92 | HR 66 | Ht 65.0 in | Wt 152.9 lb

## 2020-01-01 DIAGNOSIS — Z01419 Encounter for gynecological examination (general) (routine) without abnormal findings: Secondary | ICD-10-CM | POA: Diagnosis not present

## 2020-01-01 DIAGNOSIS — R102 Pelvic and perineal pain unspecified side: Secondary | ICD-10-CM

## 2020-01-01 DIAGNOSIS — R300 Dysuria: Secondary | ICD-10-CM

## 2020-01-01 DIAGNOSIS — N898 Other specified noninflammatory disorders of vagina: Secondary | ICD-10-CM | POA: Diagnosis present

## 2020-01-01 DIAGNOSIS — Z113 Encounter for screening for infections with a predominantly sexual mode of transmission: Secondary | ICD-10-CM | POA: Diagnosis not present

## 2020-01-01 MED ORDER — IBUPROFEN 800 MG PO TABS: 800.0000 mg | ORAL_TABLET | Freq: Three times a day (TID) | ORAL | 5 refills | Status: DC | PRN

## 2020-01-01 NOTE — Progress Notes (Signed)
Pt is in the office for annual. GAD-7=1 Pt has hx of hysterectomy, complains of cramping pain.

## 2020-01-01 NOTE — Progress Notes (Addendum)
Subjective:        Lorelai SHAINNA FAUX is a 39 y.o. female here for a routine exam.  Current complaints: Pelvic cramping and pain with urination.      Personal health questionnaire:  Is patient Ashkenazi Jewish, have a family history of breast and/or ovarian cancer: no Is there a family history of uterine cancer diagnosed at age < 29, gastrointestinal cancer, urinary tract cancer, family member who is a Personnel officer syndrome-associated carrier: no Is the patient overweight and hypertensive, family history of diabetes, personal history of gestational diabetes, preeclampsia or PCOS: no Is patient over 77, have PCOS,  family history of premature CHD under age 59, diabetes, smoke, have hypertension or peripheral artery disease:  no At any time, has a partner hit, kicked or otherwise hurt or frightened you?: no Over the past 2 weeks, have you felt down, depressed or hopeless?: no Over the past 2 weeks, have you felt little interest or pleasure in doing things?:no   Gynecologic History Patient's last menstrual period was 07/30/2013. Contraception: status post hysterectomy Last Pap: 2015. Results were: normal Last mammogram: n/a. Results were: n/a  Obstetric History OB History  Gravida Para Term Preterm AB Living  3 3 3     3   SAB TAB Ectopic Multiple Live Births          3    # Outcome Date GA Lbr Len/2nd Weight Sex Delivery Anes PTL Lv  3 Term     M CS-LTranv  N LIV  2 Term     F CS-LTranv  N LIV  1 Term     M CS-LTranv   LIV     Birth Comments: unable to deliver- "shoulders wouldn't pass"    Past Medical History:  Diagnosis Date  . Asthma    childhood - no problems as adult, no inhaler  . History of blood transfusion 2005   [facility] with c/s surgery  . Seasonal allergies     Past Surgical History:  Procedure Laterality Date  . BILATERAL SALPINGECTOMY Bilateral 08/04/2013   Procedure: BILATERAL SALPINGECTOMY;  Surgeon: 10/04/2013, MD;  Location: [facility];  Service: Gynecology;   Laterality: Bilateral;  . CESAREAN SECTION     x 3  . DILITATION & CURRETTAGE/HYSTROSCOPY WITH NOVASURE ABLATION N/A 03/31/2013   Procedure: DILATATION & CURETTAGE/HYSTEROSCOPY WITH Attempted NOVASURE ABLATION;  Surgeon: 04/02/2013, MD;  Location: [facility];  Service: Gynecology;  Laterality: N/A;  . ROBOTIC ASSISTED TOTAL HYSTERECTOMY N/A 08/04/2013   Procedure: ROBOTIC ASSISTED TOTAL HYSTERECTOMY;  Surgeon: 10/04/2013, MD;  Location: [facility];  Service: Gynecology;  Laterality: N/A;  . TUBAL LIGATION    . WISDOM TOOTH EXTRACTION       Current Outpatient Medications:  .  cetirizine (ZYRTEC) 10 MG tablet, TK 1 T PO QPM PRF ALLERGIES (Patient not taking: Reported on 01/01/2020), Disp: , Rfl:  .  diclofenac (VOLTAREN) 75 MG EC tablet, Take 1 tablet (75 mg total) by mouth 2 (two) times daily. (Patient not taking: Reported on 01/01/2020), Disp: 60 tablet, Rfl: 1 .  ibuprofen (ADVIL) 800 MG tablet, Take 1 tablet (800 mg total) by mouth every 8 (eight) hours as needed., Disp: 30 tablet, Rfl: 5 .  meloxicam (MOBIC) 15 MG tablet, TK 1 T PO QD WF (Patient not taking: Reported on 01/01/2020), Disp: , Rfl:  .  meloxicam (MOBIC) 15 MG tablet, Take 1 tablet (15 mg total) by mouth daily. (Patient not taking: Reported on 01/01/2020), Disp: 90 tablet, Rfl: 0 .  Meth-Hyo-M Bl-Na Phos-Ph Sal (URIBEL) 118 MG CAPS, Take 1 capsule (118 mg total) by mouth daily. (Patient not taking: Reported on 02/20/2019), Disp: 60 capsule, Rfl: 0 .  methylPREDNISolone (MEDROL DOSEPAK) 4 MG TBPK tablet, 6 day dose pack - take as directed (Patient not taking: Reported on 01/01/2020), Disp: 21 tablet, Rfl: 0 .  metroNIDAZOLE (METROGEL VAGINAL) 0.75 % vaginal gel, Place 1 Applicatorful vaginally at bedtime. (Patient not taking: Reported on 02/20/2019), Disp: 70 g, Rfl: 0 .  omeprazole (PRILOSEC) 20 MG capsule, TK 1 C PO QAM (Patient not taking: Reported on 01/01/2020), Disp: , Rfl:  .  tinidazole (TINDAMAX) 500 MG tablet, Take 4 tablets  (2,000 mg total) by mouth daily with breakfast. For two days (Patient not taking: Reported on 02/20/2019), Disp: 8 tablet, Rfl: 0 .  valACYclovir (VALTREX) 500 MG tablet, Take 1 tablet (500 mg total) by mouth 2 (two) times daily. For 3 days as needed (Patient not taking: Reported on 02/20/2019), Disp: 30 tablet, Rfl: prn No Known Allergies  Social History   Tobacco Use  . Smoking status: Never Smoker  . Smokeless tobacco: Never Used  Substance Use Topics  . Alcohol use: Yes    Comment: socially    Family History  Problem Relation Age of Onset  . Stroke Maternal Grandmother   . Hypertension Maternal Grandmother   . Diabetes Maternal Grandmother   . Diabetes Mother   . Hypertension Mother   . Hypertension Sister   . Diabetes Maternal Grandfather   . Breast cancer Maternal Aunt       Review of Systems  Constitutional: negative for fatigue and weight loss Respiratory: negative for cough and wheezing Cardiovascular: negative for chest pain, fatigue and palpitations Gastrointestinal: negative for abdominal pain and change in bowel habits Musculoskeletal:negative for myalgias Neurological: negative for gait problems and tremors Behavioral/Psych: negative for abusive relationship, depression Endocrine: negative for temperature intolerance    Genitourinary:positive for pelvic pain and dysuria Integument/breast: negative for breast lump, breast tenderness, nipple discharge and skin lesion(s)    Objective:       BP (!) 132/92   Pulse 66   Ht 5\' 5"  (1.651 m)   Wt 152 lb 14.4 oz (69.4 kg)   LMP 07/30/2013   BMI 25.44 kg/m  General:   alert and no distress  Skin:   no rash or abnormalities  Lungs:   clear to auscultation bilaterally  Heart:   regular rate and rhythm, S1, S2 normal, no murmur, click, rub or gallop  Breasts:   normal without suspicious masses, skin or nipple changes or axillary nodes  Abdomen:  normal findings: no organomegaly, soft, non-tender and no hernia   Pelvis:  External genitalia: normal general appearance Urinary system: urethral meatus normal and bladder without fullness, nontender Vaginal: normal without tenderness, induration or masses Cervix: absent Adnexa: normal bimanual exam Uterus: absent   Lab Review urine pregnancy test Labs reviewed yes Radiologic studies reviewed no  50% of 20 min visit spent on counseling and coordination of care.   Assessment:     1. Encounter for gynecological examination  2. Pelvic pain Rx: - ibuprofen (ADVIL) 800 MG tablet; Take 1 tablet (800 mg total) by mouth every 8 (eight) hours as needed.  Dispense: 30 tablet; Refill: 5  3. Dysuria Rx: - Urine Culture  4. Screening for STD (sexually transmitted disease) Rx: - Cervicovaginal ancillary only( [address])   Plan:    Education reviewed: calcium supplements, depression evaluation, low fat, low cholesterol diet,  safe sex/STD prevention, self breast exams and weight bearing exercise. Follow up in: 2 weeks.   Meds ordered this encounter  Medications  . ibuprofen (ADVIL) 800 MG tablet    Sig: Take 1 tablet (800 mg total) by mouth every 8 (eight) hours as needed.    Dispense:  30 tablet    Refill:  5   Orders Placed This Encounter  Procedures  . Urine Culture    Brock Bad, MD 01/01/2020 3:13 PM

## 2020-01-02 ENCOUNTER — Other Ambulatory Visit: Payer: Self-pay | Admitting: Obstetrics

## 2020-01-02 DIAGNOSIS — B373 Candidiasis of vulva and vagina: Secondary | ICD-10-CM

## 2020-01-02 DIAGNOSIS — B3731 Acute candidiasis of vulva and vagina: Secondary | ICD-10-CM

## 2020-01-02 DIAGNOSIS — N760 Acute vaginitis: Secondary | ICD-10-CM

## 2020-01-02 DIAGNOSIS — B9689 Other specified bacterial agents as the cause of diseases classified elsewhere: Secondary | ICD-10-CM

## 2020-01-02 LAB — CERVICOVAGINAL ANCILLARY ONLY
Bacterial Vaginitis (gardnerella): POSITIVE — AB
Candida Glabrata: NEGATIVE
Candida Vaginitis: POSITIVE — AB
Chlamydia: NEGATIVE
Comment: NEGATIVE
Comment: NEGATIVE
Comment: NEGATIVE
Comment: NEGATIVE
Comment: NEGATIVE
Comment: NORMAL
Neisseria Gonorrhea: NEGATIVE
Trichomonas: NEGATIVE

## 2020-01-02 MED ORDER — TINIDAZOLE 500 MG PO TABS: 1000.0000 mg | ORAL_TABLET | Freq: Every day | ORAL | 2 refills | Status: DC

## 2020-01-02 MED ORDER — FLUCONAZOLE 150 MG PO TABS: 150.0000 mg | ORAL_TABLET | Freq: Once | ORAL | 0 refills | Status: AC

## 2020-01-03 ENCOUNTER — Telehealth: Payer: Self-pay

## 2020-01-03 LAB — URINE CULTURE: Organism ID, Bacteria: NO GROWTH

## 2020-01-03 NOTE — Telephone Encounter (Signed)
-----   Message from Brock Bad, MD sent at 01/02/2020  4:24 PM EDT ----- Tindamax Rx for BV Diflucan Rx for yeast

## 2020-01-03 NOTE — Telephone Encounter (Signed)
TC to pt to make aware of results No answer LVM.

## 2020-10-17 ENCOUNTER — Emergency Department (HOSPITAL_COMMUNITY)
Admission: EM | Admit: 2020-10-17 | Discharge: 2020-10-17 | Disposition: A | Discharge status: Home / Self Care | Payer: Medicaid Other | Attending: Emergency Medicine | Admitting: Emergency Medicine

## 2020-10-17 ENCOUNTER — Encounter (HOSPITAL_COMMUNITY): Payer: Self-pay

## 2020-10-17 ENCOUNTER — Other Ambulatory Visit: Payer: Self-pay

## 2020-10-17 ENCOUNTER — Emergency Department (HOSPITAL_COMMUNITY): Payer: Medicaid Other

## 2020-10-17 DIAGNOSIS — M545 Low back pain, unspecified: Secondary | ICD-10-CM | POA: Insufficient documentation

## 2020-10-17 DIAGNOSIS — R109 Unspecified abdominal pain: Secondary | ICD-10-CM | POA: Insufficient documentation

## 2020-10-17 DIAGNOSIS — J45909 Unspecified asthma, uncomplicated: Secondary | ICD-10-CM | POA: Diagnosis not present

## 2020-10-17 DIAGNOSIS — R309 Painful micturition, unspecified: Secondary | ICD-10-CM | POA: Insufficient documentation

## 2020-10-17 DIAGNOSIS — M549 Dorsalgia, unspecified: Secondary | ICD-10-CM | POA: Diagnosis present

## 2020-10-17 LAB — CBC WITH DIFFERENTIAL/PLATELET
Abs Immature Granulocytes: 0.02 10*3/uL (ref 0.00–0.07)
Basophils Absolute: 0 10*3/uL (ref 0.0–0.1)
Basophils Relative: 0 %
Eosinophils Absolute: 0.1 10*3/uL (ref 0.0–0.5)
Eosinophils Relative: 2 %
HCT: 37.5 % (ref 36.0–46.0)
Hemoglobin: 12.4 g/dL (ref 12.0–15.0)
Immature Granulocytes: 0 %
Lymphocytes Relative: 33 %
Lymphs Abs: 2 10*3/uL (ref 0.7–4.0)
MCH: 30.7 pg (ref 26.0–34.0)
MCHC: 33.1 g/dL (ref 30.0–36.0)
MCV: 92.8 fL (ref 80.0–100.0)
Monocytes Absolute: 0.4 10*3/uL (ref 0.1–1.0)
Monocytes Relative: 7 %
Neutro Abs: 3.4 10*3/uL (ref 1.7–7.7)
Neutrophils Relative %: 58 %
Platelets: 209 10*3/uL (ref 150–400)
RBC: 4.04 MIL/uL (ref 3.87–5.11)
RDW: 12.8 % (ref 11.5–15.5)
WBC: 6 10*3/uL (ref 4.0–10.5)
nRBC: 0 % (ref 0.0–0.2)

## 2020-10-17 LAB — URINALYSIS, ROUTINE W REFLEX MICROSCOPIC
Bilirubin Urine: NEGATIVE
Glucose, UA: NEGATIVE mg/dL
Hgb urine dipstick: NEGATIVE
Ketones, ur: NEGATIVE mg/dL
Leukocytes,Ua: NEGATIVE
Nitrite: NEGATIVE
Protein, ur: NEGATIVE mg/dL
Specific Gravity, Urine: 1.024 (ref 1.005–1.030)
pH: 5 (ref 5.0–8.0)

## 2020-10-17 LAB — COMPREHENSIVE METABOLIC PANEL
ALT: 10 U/L (ref 0–44)
AST: 12 U/L — ABNORMAL LOW (ref 15–41)
Albumin: 3.9 g/dL (ref 3.5–5.0)
Alkaline Phosphatase: 35 U/L — ABNORMAL LOW (ref 38–126)
Anion gap: 3 — ABNORMAL LOW (ref 5–15)
BUN: 16 mg/dL (ref 6–20)
CO2: 26 mmol/L (ref 22–32)
Calcium: 9.1 mg/dL (ref 8.9–10.3)
Chloride: 108 mmol/L (ref 98–111)
Creatinine, Ser: 1.04 mg/dL — ABNORMAL HIGH (ref 0.44–1.00)
GFR, Estimated: >60 mL/min (ref >60)
Glucose, Bld: 88 mg/dL (ref 70–99)
Potassium: 3.7 mmol/L (ref 3.5–5.1)
Sodium: 137 mmol/L (ref 135–145)
Total Bilirubin: 0.3 mg/dL (ref 0.3–1.2)
Total Protein: 6.6 g/dL (ref 6.5–8.1)

## 2020-10-17 LAB — LIPASE, BLOOD: Lipase: 26 U/L (ref 11–51)

## 2020-10-17 NOTE — ED Triage Notes (Signed)
Pt arrived via POV, c/o bilateral flank pain and lower back pain, dysuria, diarrhea. Believes this is related to kidneys. Pain worsening this week.

## 2020-10-17 NOTE — ED Provider Notes (Signed)
Emergency Medicine Provider Triage Evaluation Note  [name] [name] , [name] 40 y.o. female  was evaluated in triage.  Pt complains of BL flank pain x months. Dysuria x 3 days ago. No blood in urine. No fever. Flank pain worse with movement. No hx of kidney stones. Chronic diarrhea, no travel or abx use. No sick contacts. No CP, SOB. No hx of PE DVT  Hx hysterectomy  Review of Systems  Positive: Flank pain, dysuria Negative: Fever, chills, emesis  Physical Exam  BP (!) 124/98 (BP Location: Left Arm)   Pulse 95   Temp 98.6 F (37 C) (Oral)   Resp 18   LMP 07/30/2013   SpO2 98%  Gen:   Awake, no distress   Resp:  Normal effort  MSK:   Moves extremities without difficulty, BL flank pain Other:    Medical Decision Making  Medically screening exam initiated at 2:06 PM.  Appropriate orders placed.  [name] [name] was informed that the remainder of the evaluation will be completed by another provider, this initial triage assessment does not replace that evaluation, and the importance of remaining in the ED until their evaluation is complete.   Flank pain x months, dysuria 3 days    [name] Razon A, PA-C 10/17/20 1409    Little, Ambrose Finland, MD 10/17/20 1514

## 2020-10-17 NOTE — ED Provider Notes (Signed)
[facility]-EMERGENCY DEPT Provider Note   CSN: [id number] Arrival date & time: 10/17/20  1325     History Chief Complaint  Patient presents with  . Flank Pain    [name] [name] is a 40 y.o. female.  The history is provided by the patient. No language interpreter was used.  Flank Pain      40 year old female significant history of asthma presents today with complaints of back pain and abd pain.  Patient report for more than 3 months she has been having persistent pain to her lower back.  Pain is achy throbbing sometimes radiates towards her abdomen and upper chest.  Pain happen on a daily basis, more noticeable with sitting upright and walking and improves with laying down.  For the past several days has she also reported increasing pain, as well as having some urinary discomfort.  No associated fever chills headache runny nose sneezing coughing chest pain shortness of breath nausea vomiting or diarrhea.  She mention she was seen by her PCP for this complaint previously and states her work-up was unremarkable.  She was prescribed medication for arthritis but it did not provide any relief.  Patient has had hysterectomy.  She denies any strength activities or heavy lifting.  She report her pain is mild to moderate at this time  Past Medical History:  Diagnosis Date  . Asthma    childhood - no problems as adult, no inhaler  . History of blood transfusion 2005   [facility] with c/s surgery  . Seasonal allergies     Patient Active Problem List   Diagnosis Date Noted  . ASTHMA, UNSPECIFIED 07/29/2006    Past Surgical History:  Procedure Laterality Date  . BILATERAL SALPINGECTOMY Bilateral 08/04/2013   Procedure: BILATERAL SALPINGECTOMY;  Surgeon: Antionette Char, MD;  Location: [facility];  Service: Gynecology;  Laterality: Bilateral;  . CESAREAN SECTION     x 3  . DILITATION & CURRETTAGE/HYSTROSCOPY WITH NOVASURE ABLATION N/A 03/31/2013   Procedure: DILATATION &  CURETTAGE/HYSTEROSCOPY WITH Attempted NOVASURE ABLATION;  Surgeon: Antionette Char, MD;  Location: [facility];  Service: Gynecology;  Laterality: N/A;  . ROBOTIC ASSISTED TOTAL HYSTERECTOMY N/A 08/04/2013   Procedure: ROBOTIC ASSISTED TOTAL HYSTERECTOMY;  Surgeon: Antionette Char, MD;  Location: [facility];  Service: Gynecology;  Laterality: N/A;  . TUBAL LIGATION    . WISDOM TOOTH EXTRACTION       OB History    Gravida  3   Para  3   Term  3   Preterm      AB      Living  3     SAB      IAB      Ectopic      Multiple      Live Births  3           Family History  Problem Relation Age of Onset  . Stroke Maternal Grandmother   . Hypertension Maternal Grandmother   . Diabetes Maternal Grandmother   . Diabetes Mother   . Hypertension Mother   . Hypertension Sister   . Diabetes Maternal Grandfather   . Breast cancer Maternal Aunt     Social History   Tobacco Use  . Smoking status: Never Smoker  . Smokeless tobacco: Never Used  Substance Use Topics  . Alcohol use: Yes    Comment: socially  . Drug use: No    Home Medications Prior to Admission medications   Medication Sig Start Date End  Date Taking? Authorizing Provider  cetirizine (ZYRTEC) 10 MG tablet TK 1 T PO QPM PRF ALLERGIES Patient not taking: Reported on 01/01/2020 01/26/19   [provider]  diclofenac (VOLTAREN) 75 MG EC tablet Take 1 tablet (75 mg total) by mouth 2 (two) times daily. Patient not taking: Reported on 01/01/2020 03/15/19   Felecia Shelling, DPM  ibuprofen (ADVIL) 800 MG tablet Take 1 tablet (800 mg total) by mouth every 8 (eight) hours as needed. 01/01/20   Brock Bad, MD  meloxicam (MOBIC) 15 MG tablet TK 1 T PO QD WF Patient not taking: Reported on 01/01/2020 01/26/19   [provider]  meloxicam (MOBIC) 15 MG tablet Take 1 tablet (15 mg total) by mouth daily. Patient not taking: Reported on 01/01/2020 03/15/19   Felecia Shelling, DPM  Meth-Hyo-M Bl-Na Phos-Ph Sal  (URIBEL) 118 MG CAPS Take 1 capsule (118 mg total) by mouth daily. Patient not taking: Reported on 02/20/2019 12/13/13   Antionette Char, MD  methylPREDNISolone (MEDROL DOSEPAK) 4 MG TBPK tablet 6 day dose pack - take as directed Patient not taking: Reported on 01/01/2020 02/20/19   Felecia Shelling, DPM  metroNIDAZOLE (METROGEL VAGINAL) 0.75 % vaginal gel Place 1 Applicatorful vaginally at bedtime. Patient not taking: Reported on 02/20/2019 04/27/14   Brock Bad, MD  omeprazole (PRILOSEC) 20 MG capsule TK 1 C PO QAM Patient not taking: Reported on 01/01/2020 01/26/19   [provider]  tinidazole (TINDAMAX) 500 MG tablet Take 4 tablets (2,000 mg total) by mouth daily with breakfast. For two days Patient not taking: Reported on 02/20/2019 09/01/18   Sharyon Cable, CNM  tinidazole (TINDAMAX) 500 MG tablet Take 2 tablets (1,000 mg total) by mouth daily with breakfast. 01/02/20   Brock Bad, MD  valACYclovir (VALTREX) 500 MG tablet Take 1 tablet (500 mg total) by mouth 2 (two) times daily. For 3 days as needed Patient not taking: Reported on 02/20/2019 04/03/14   Antionette Char, MD    Allergies    Patient has no known allergies.  Review of Systems   Review of Systems  Genitourinary: Positive for flank pain.  All other systems reviewed and are negative.   Physical Exam Updated Vital Signs BP (!) 124/98 (BP Location: Left Arm)   Pulse 95   Temp 98.6 F (37 C) (Oral)   Resp 18   LMP 07/30/2013   SpO2 98%   Physical Exam Vitals and nursing note reviewed.  Constitutional:      General: She is not in acute distress.    Appearance: She is well-developed.  HENT:     Head: Atraumatic.  Eyes:     Conjunctiva/sclera: Conjunctivae normal.  Cardiovascular:     Rate and Rhythm: Normal rate and regular rhythm.     Pulses: Normal pulses.     Heart sounds: Normal heart sounds.  Pulmonary:     Effort: Pulmonary effort is normal.     Breath sounds: Normal breath sounds.   Abdominal:     Palpations: Abdomen is soft.     Tenderness: There is no abdominal tenderness. There is no right CVA tenderness or left CVA tenderness.  Musculoskeletal:        General: Tenderness (Mild tenderness along cervical midline spine without crepitus or step-off.  Normal range of motion.) present.     Cervical back: Neck supple.  Skin:    Findings: No rash.  Neurological:     Mental Status: She is alert. Mental status is  at baseline.     Comments: 5 out of 5 strength all 4 extremities.  Psychiatric:        Mood and Affect: Mood normal.     ED Results / Procedures / Treatments   Labs (all labs ordered are listed, but only abnormal results are displayed) Labs Reviewed  COMPREHENSIVE METABOLIC PANEL - Abnormal; Notable for the following components:      Result Value   Creatinine, Ser 1.04 (*)    AST 12 (*)    Alkaline Phosphatase 35 (*)    Anion gap 3 (*)    All other components within normal limits  CBC WITH DIFFERENTIAL/PLATELET  LIPASE, BLOOD  URINALYSIS, ROUTINE W REFLEX MICROSCOPIC    EKG None  Radiology DG Lumbar Spine Complete  Result Date: 10/17/2020 CLINICAL DATA:  Low back pain EXAM: LUMBAR SPINE - COMPLETE 4+ VIEW COMPARISON:  None. FINDINGS: Five lumbar type vertebral bodies are well visualized. Vertebral body height is well maintained. No pars defects are noted. No anterolisthesis is seen. Mild disc space narrowing is noted at L4-5 with mild osteophytic changes. No soft tissue abnormality is seen. IMPRESSION: Mild degenerative change without acute abnormality. Electronically Signed   By: Alcide Clever M.D.   On: 10/17/2020 16:50    Procedures Procedures   Medications Ordered in ED Medications - No data to display  ED Course  I have reviewed the triage vital signs and the nursing notes.  Pertinent labs & imaging results that were available during my care of the patient were reviewed by me and considered in my medical decision making (see chart for  details).    MDM Rules/Calculators/A&P                          BP 128/82   Pulse 66   Temp 98.6 F (37 C) (Oral)   Resp 18   LMP 07/30/2013   SpO2 96%   Final Clinical Impression(s) / ED Diagnoses Final diagnoses:  Bilateral low back pain without sciatica, unspecified chronicity    Rx / DC Orders ED Discharge Orders    None     3:16 PM Patient here with persistent lower back pain for more than 3 months worsening the past 2 days.  No obvious focal point tenderness on my exam.  Patient overall well-appearing.  Abdomen soft nontender.  Her vital signs stable.  Will check basic labs, obtain x-ray of L-spine but I have a feeling we may not be able to pinpoint the source of her problem during this visit given the chronicity of her complaint.  5:23 PM UA without signs of urinary tract infection, labs are reassuring, L-spine x-ray unremarkable.  I encourage patient to follow-up with her primary care provider for further evaluation.   Fayrene Helper, PA-C 10/17/20 1732    Horton, Clabe Seal, DO 10/17/20 2031

## 2020-10-17 NOTE — Discharge Instructions (Signed)
You have been evaluated for your back pain and abdominal pain.  X-ray of your spine without concerning finding, labs are reassuring, and your vital signs are normal.  Please call and follow-up closely with your primary care doctor for further evaluation.  Return if any concern.  You may continue taking over-the-counter Tylenol or ibuprofen as needed for pain.

## 2020-10-21 ENCOUNTER — Encounter: Payer: Self-pay | Admitting: Physician Assistant

## 2020-10-21 ENCOUNTER — Telehealth: Payer: Medicaid Other | Admitting: Physician Assistant

## 2020-10-21 DIAGNOSIS — Z09 Encounter for follow-up examination after completed treatment for conditions other than malignant neoplasm: Secondary | ICD-10-CM | POA: Diagnosis not present

## 2020-10-21 DIAGNOSIS — M545 Low back pain, unspecified: Secondary | ICD-10-CM | POA: Diagnosis not present

## 2020-10-21 MED ORDER — NAPROXEN 500 MG PO TABS: 500.0000 mg | ORAL_TABLET | Freq: Two times a day (BID) | ORAL | 0 refills | Status: DC

## 2020-10-21 MED ORDER — METHOCARBAMOL 500 MG PO TABS: 500.0000 mg | ORAL_TABLET | Freq: Two times a day (BID) | ORAL | 0 refills | Status: DC | PRN

## 2020-10-21 NOTE — Patient Instructions (Addendum)
1. Follow up  - Reviewed and discussed lab work from ER visit on 10/17/2020.  No evidence of urinary tract infection.  Labs were reassuring.  Did discuss slightly elevated serum creatinine and recommended increased water intake.  Serum creatinine though slightly elevated does appear to be baseline for patient.  2. Acute midline low back pain without sciatica  -Persistent pain ongoing.  Recently worse after urination.  Labs were without evidence of urinary tract infection.  No blood in urine to suggest nephrolithiasis.  Plain films of her L-spine do show mild disc space narrowing at L4-5 with mild osteophytic changes.  Patient has not tried any anti-inflammatories.  Will give naproxen and Robaxin for symptom management.  Also discussed back exercises for stretching and further evaluation by PCP if pain persists over the next few weeks.  - Sooner follow-up for worsening urinary symptoms, hematuria, fevers, abdominal pain or vomiting.    Acute Back Pain, Adult Acute back pain is sudden and usually short-lived. It is often caused by an injury to the muscles and tissues in the back. The injury may result from:  A muscle or ligament getting overstretched or torn (strained). Ligaments are tissues that connect bones to each other. Lifting something improperly can cause a back strain.  Wear and tear (degeneration) of the spinal disks. Spinal disks are circular tissue that provide cushioning between the bones of the spine (vertebrae).  Twisting motions, such as while playing sports or doing yard work.  A hit to the back.  Arthritis. You may have a physical exam, lab tests, and imaging tests to find the cause of your pain. Acute back pain usually goes away with rest and home care. Follow these instructions at home: Managing pain, stiffness, and swelling  Treatment may include medicines for pain and inflammation that are taken by mouth or applied to the skin, prescription pain medicine, or muscle  relaxants. Take over-the-counter and prescription medicines only as told by your health care provider.  Your health care provider may recommend applying ice during the first 24-48 hours after your pain starts. To do this: ? Put ice in a plastic bag. ? Place a towel between your skin and the bag. ? Leave the ice on for 20 minutes, 2-3 times a day.  If directed, apply heat to the affected area as often as told by your health care provider. Use the heat source that your health care provider recommends, such as a moist heat pack or a heating pad. ? Place a towel between your skin and the heat source. ? Leave the heat on for 20-30 minutes. ? Remove the heat if your skin turns bright red. This is especially important if you are unable to feel pain, heat, or cold. You have a greater risk of getting burned. Activity  Do not stay in bed. Staying in bed for more than 1-2 days can delay your recovery.  Sit up and stand up straight. Avoid leaning forward when you sit or hunching over when you stand. ? If you work at a desk, sit close to it so you do not need to lean over. Keep your chin tucked in. Keep your neck drawn back, and keep your elbows bent at a 90-degree angle (right angle). ? Sit high and close to the steering wheel when you drive. Add lower back (lumbar) support to your car seat, if needed.  Take short walks on even surfaces as soon as you are able. Try to increase the length of time you walk  each day.  Do not sit, drive, or stand in one place for more than 30 minutes at a time. Sitting or standing for long periods of time can put stress on your back.  Do not drive or use heavy machinery while taking prescription pain medicine.  Use proper lifting techniques. When you bend and lift, use positions that put less stress on your back: ? [place] your knees. ? Keep the load close to your body. ? Avoid twisting.  Exercise regularly as told by your health care provider. Exercising helps your back  heal faster and helps prevent back injuries by keeping muscles strong and flexible.  Work with a physical therapist to make a safe exercise program, as recommended by your health care provider. Do any exercises as told by your physical therapist.   Lifestyle  Maintain a healthy weight. Extra weight puts stress on your back and makes it difficult to have good posture.  Avoid activities or situations that make you feel anxious or stressed. Stress and anxiety increase muscle tension and can make back pain worse. Learn ways to manage anxiety and stress, such as through exercise. General instructions  Sleep on a firm mattress in a comfortable position. Try lying on your side with your knees slightly bent. If you lie on your back, put a pillow under your knees.  Follow your treatment plan as told by your health care provider. This may include: ? Cognitive or behavioral therapy. ? Acupuncture or massage therapy. ? Meditation or yoga. Contact a health care provider if:  You have pain that is not relieved with rest or medicine.  You have increasing pain going down into your legs or buttocks.  Your pain does not improve after 2 weeks.  You have pain at night.  You lose weight without trying.  You have a fever or chills. Get help right away if:  You develop new bowel or bladder control problems.  You have unusual weakness or numbness in your arms or legs.  You develop nausea or vomiting.  You develop abdominal pain.  You feel faint. Summary  Acute back pain is sudden and usually short-lived.  Use proper lifting techniques. When you bend and lift, use positions that put less stress on your back.  Take over-the-counter and prescription medicines and apply heat or ice as directed by your health care provider. This information is not intended to replace advice given to you by your health care provider. Make sure you discuss any questions you have with your health care  provider. Document Revised: 02/09/2020 Document Reviewed: 02/09/2020 Elsevier Patient Education  2021 [employer]    Back Exercises The following exercises strengthen the muscles that help to support the trunk and back. They also help to keep the lower back flexible. Doing these exercises can help to prevent back pain or lessen existing pain.  If you have back pain or discomfort, try doing these exercises 2-3 times each day or as told by your health care provider.  As your pain improves, do them once each day, but increase the number of times that you repeat the steps for each exercise (do more repetitions).  To prevent the recurrence of back pain, continue to do these exercises once each day or as told by your health care provider. Do exercises exactly as told by your health care provider and adjust them as directed. It is normal to feel mild stretching, pulling, tightness, or discomfort as you do these exercises, but you should stop right away  if you feel sudden pain or your pain gets worse. Exercises Single knee to chest Repeat these steps 3-5 times for each leg: 1. Lie on your back on a firm bed or the floor with your legs extended. 2. Bring one knee to your chest. Your other leg should stay extended and in contact with the floor. 3. Hold your knee in place by grabbing your knee or thigh with both hands and hold. 4. Pull on your knee until you feel a gentle stretch in your lower back or buttocks. 5. Hold the stretch for 10-30 seconds. 6. Slowly release and straighten your leg. Pelvic tilt Repeat these steps 5-10 times: 1. Lie on your back on a firm bed or the floor with your legs extended. 2. Bend your knees so they are pointing toward the ceiling and your feet are flat on the floor. 3. Tighten your lower abdominal muscles to press your lower back against the floor. This motion will tilt your pelvis so your tailbone points up toward the ceiling instead of pointing to your feet or  the floor. 4. With gentle tension and even breathing, hold this position for 5-10 seconds. Cat-cow Repeat these steps until your lower back becomes more flexible: 1. Get into a hands-and-knees position on a firm surface. Keep your hands under your shoulders, and keep your knees under your hips. You may place padding under your knees for comfort. 2. Let your head hang down toward your chest. Contract your abdominal muscles and point your tailbone toward the floor so your lower back becomes rounded like the back of a cat. 3. Hold this position for 5 seconds. 4. Slowly lift your head, let your abdominal muscles relax and point your tailbone up toward the ceiling so your back forms a sagging arch like the back of a cow. 5. Hold this position for 5 seconds.   Press-ups Repeat these steps 5-10 times: 1. Lie on your abdomen (face-down) on the floor. 2. Place your palms near your head, about shoulder-width apart. 3. Keeping your back as relaxed as possible and keeping your hips on the floor, slowly straighten your arms to raise the top half of your body and lift your shoulders. Do not use your back muscles to raise your upper torso. You may adjust the placement of your hands to make yourself more comfortable. 4. Hold this position for 5 seconds while you keep your back relaxed. 5. Slowly return to lying flat on the floor.   Bridges Repeat these steps 10 times: 1. Lie on your back on a firm surface. 2. Bend your knees so they are pointing toward the ceiling and your feet are flat on the floor. Your arms should be flat at your sides, next to your body. 3. Tighten your buttocks muscles and lift your buttocks off the floor until your waist is at almost the same height as your knees. You should feel the muscles working in your buttocks and the back of your thighs. If you do not feel these muscles, slide your feet 1-2 inches farther away from your buttocks. 4. Hold this position for 3-5 seconds. 5. Slowly  lower your hips to the starting position, and allow your buttocks muscles to relax completely. If this exercise is too easy, try doing it with your arms crossed over your chest.   Abdominal crunches Repeat these steps 5-10 times: 1. Lie on your back on a firm bed or the floor with your legs extended. 2. Bend your knees so they are  pointing toward the ceiling and your feet are flat on the floor. 3. Cross your arms over your chest. 4. Tip your chin slightly toward your chest without bending your neck. 5. Tighten your abdominal muscles and slowly raise your trunk (torso) high enough to lift your shoulder blades a tiny bit off the floor. Avoid raising your torso higher than that because it can put too much stress on your low back and does not help to strengthen your abdominal muscles. 6. Slowly return to your starting position. Back lifts Repeat these steps 5-10 times: 1. Lie on your abdomen (face-down) with your arms at your sides, and rest your forehead on the floor. 2. Tighten the muscles in your legs and your buttocks. 3. Slowly lift your chest off the floor while you keep your hips pressed to the floor. Keep the back of your head in line with the curve in your back. Your eyes should be looking at the floor. 4. Hold this position for 3-5 seconds. 5. Slowly return to your starting position. Contact a health care provider if:  Your back pain or discomfort gets much worse when you do an exercise.  Your worsening back pain or discomfort does not lessen within 2 hours after you exercise. If you have any of these problems, stop doing these exercises right away. Do not do them again unless your health care provider says that you can. Get help right away if:  You develop sudden, severe back pain. If this happens, stop doing the exercises right away. Do not do them again unless your health care provider says that you can. This information is not intended to replace advice given to you by your health  care provider. Make sure you discuss any questions you have with your health care provider. Document Revised: 09/22/2018 Document Reviewed: 02/17/2018 Elsevier Patient Education  2021 [employer].

## 2020-10-21 NOTE — Progress Notes (Signed)
[name], sylvan are scheduled for a virtual visit with your provider today.    Just as we do with appointments in the office, we must obtain your consent to participate.  Your consent will be active for this visit and any virtual visit you may have with one of our providers in the next 365 days.    If you have a MyChart account, I can also send a copy of this consent to you electronically.  All virtual visits are billed to your insurance company just like a traditional visit in the office.  As this is a virtual visit, video technology does not allow for your provider to perform a traditional examination.  This may limit your provider's ability to fully assess your condition.  If your provider identifies any concerns that need to be evaluated in person or the need to arrange testing such as labs, EKG, etc, we will make arrangements to do so.    Although advances in technology are sophisticated, we cannot ensure that it will always work on either your end or our end.  If the connection with a video visit is poor, we may have to switch to a telephone visit.  With either a video or telephone visit, we are not always able to ensure that we have a secure connection.   I need to obtain your verbal consent now.   Are you willing to proceed with your visit today?   Zhuri ETERNITY DEXTER has provided verbal consent on 10/21/2020 for a virtual visit (video or telephone).   Dierdre Forth, PA-C 10/21/2020  2:37 PM   Date:  10/21/2020   ID:  [name] [name], DOB 1980-12-09, MRN [id number]  Patient Location: Home Provider Location: Home Office   Participants: Patient and Provider for Visit and Wrap up  Method of visit: Video  Location of Patient: Home Location of Provider: Home Office Consent was obtain for visit over the video. Services rendered by provider: Visit was performed via video  A video enabled telemedicine application was used and I verified that I am speaking with the correct person  using two identifiers.  PCP:  Fleet Contras, MD   Chief Complaint:  ER follow-up  History of Present Illness:    [name] [name] is a 40 y.o. female with history as stated below. Presents video telehealth for an acute care visit  Patient presents with complains of bilateral flank pain and low back pain for numerous months.  She reports that the pain waxes and wanes.  Pain is worse with movement.  She reports that 3 days prior to her ER visit she developed dysuria.  She denies a history of fever, chills, abdominal pain, nausea, vomiting.  Patient has no history of kidney stones.  No other aggravating or relieving factors.  No other c/o.  Patient's ER visit on 10/17/2020 reviewed.  Work-up was reassuring.  No evidence of urinary tract infection.  Plain films without evidence of compression fracture.  Mild degenerative changes noted to the L-spine.  Neuro exam at that visit was within normal limits.  Symptoms today are unchanged from evaluation last week.  She is frustrated that she continues to have discomfort and no answer.  Past Medical, Surgical, Social History, Allergies, and Medications have been Reviewed.  Patient Active Problem List   Diagnosis Date Noted  . ASTHMA, UNSPECIFIED 07/29/2006    Social History   Tobacco Use  . Smoking status: Never Smoker  . Smokeless tobacco: Never Used  Substance Use Topics  .  Alcohol use: Yes    Comment: socially     Current Outpatient Medications:  .  methocarbamol (ROBAXIN) 500 MG tablet, Take 1 tablet (500 mg total) by mouth 2 (two) times daily as needed for muscle spasms., Disp: 20 tablet, Rfl: 0 .  naproxen (NAPROSYN) 500 MG tablet, Take 1 tablet (500 mg total) by mouth 2 (two) times daily with a meal., Disp: 30 tablet, Rfl: 0 .  metroNIDAZOLE (METROGEL VAGINAL) 0.75 % vaginal gel, Place 1 Applicatorful vaginally at bedtime. (Patient not taking: Reported on 02/20/2019), Disp: 70 g, Rfl: 0   No Known Allergies   Review of Systems   Constitutional: Negative for chills and fever.  HENT: Negative for congestion, ear pain and sore throat.   Eyes: Negative for blurred vision and double vision.  Respiratory: Negative for cough, shortness of breath and wheezing.   Cardiovascular: Negative for chest pain, palpitations and leg swelling.  Gastrointestinal: Negative for abdominal pain, diarrhea, nausea and vomiting.  Genitourinary: Positive for dysuria.  Musculoskeletal: Positive for back pain. Negative for myalgias.  Skin: Negative for rash.  Neurological: Negative for loss of consciousness, weakness and headaches.  Psychiatric/Behavioral: The patient is not nervous/anxious.    See HPI for history of present illness.  Physical Exam Constitutional:      General: She is not in acute distress.    Appearance: Normal appearance. She is not ill-appearing.  HENT:     Head: Normocephalic and atraumatic.     Nose: No congestion.  Eyes:     Extraocular Movements: Extraocular movements intact.  Pulmonary:     Effort: Pulmonary effort is normal.  Musculoskeletal:        General: Normal range of motion.     Cervical back: Normal range of motion.  Skin:    Coloration: Skin is not pale.  Neurological:     General: No focal deficit present.     Mental Status: She is alert. Mental status is at baseline.  Psychiatric:        Mood and Affect: Mood normal.               A&P  1. Follow up  - Reviewed and discussed lab work from ER visit on 10/17/2020.  No evidence of urinary tract infection.  Labs were reassuring.  Did discuss slightly elevated serum creatinine and recommended increased water intake.  Serum creatinine though slightly elevated does appear to be baseline for patient.  2. Acute midline low back pain without sciatica  -Persistent pain ongoing.  Recently worse after urination.  Labs were without evidence of urinary tract infection.  No blood in urine to suggest nephrolithiasis.  Plain films of her L-spine do show  mild disc space narrowing at L4-5 with mild osteophytic changes.  Patient has not tried any anti-inflammatories.  Will give naproxen and Robaxin for symptom management.  Also discussed back exercises for stretching and further evaluation by PCP if pain persists over the next few weeks.  - Sooner follow-up for worsening urinary symptoms, hematuria, fevers, abdominal pain or vomiting.    Patient voiced understanding and agreement to plan.   Time:   Today, I have spent 15 minutes with the patient with telehealth technology discussing the above problems, reviewing the chart, previous notes, medications and orders.    Tests Ordered: No orders of the defined types were placed in this encounter.   Medication Changes: Meds ordered this encounter  Medications  . naproxen (NAPROSYN) 500 MG tablet    Sig: Take 1  tablet (500 mg total) by mouth 2 (two) times daily with a meal.    Dispense:  30 tablet    Refill:  0  . methocarbamol (ROBAXIN) 500 MG tablet    Sig: Take 1 tablet (500 mg total) by mouth 2 (two) times daily as needed for muscle spasms.    Dispense:  20 tablet    Refill:  0     Disposition:  Follow up PCP in the next week if symptoms persist.  Signed, Dierdre Forth, PA-C  10/21/2020 2:56 PM

## 2020-10-26 ENCOUNTER — Telehealth: Payer: Medicaid Other | Admitting: Emergency Medicine

## 2020-10-26 DIAGNOSIS — J039 Acute tonsillitis, unspecified: Secondary | ICD-10-CM | POA: Diagnosis not present

## 2020-10-26 MED ORDER — AMOXICILLIN 500 MG PO CAPS: 500.0000 mg | ORAL_CAPSULE | Freq: Two times a day (BID) | ORAL | 0 refills | Status: AC

## 2020-10-26 NOTE — Progress Notes (Signed)

## 2021-05-07 ENCOUNTER — Telehealth: Payer: Medicaid Other | Admitting: Nurse Practitioner

## 2021-05-07 ENCOUNTER — Telehealth: Payer: Medicaid Other | Admitting: Emergency Medicine

## 2021-05-07 DIAGNOSIS — N760 Acute vaginitis: Secondary | ICD-10-CM | POA: Diagnosis not present

## 2021-05-07 DIAGNOSIS — M545 Low back pain, unspecified: Secondary | ICD-10-CM

## 2021-05-07 DIAGNOSIS — R1084 Generalized abdominal pain: Secondary | ICD-10-CM

## 2021-05-07 DIAGNOSIS — N898 Other specified noninflammatory disorders of vagina: Secondary | ICD-10-CM

## 2021-05-07 MED ORDER — METRONIDAZOLE 500 MG PO TABS: 500.0000 mg | ORAL_TABLET | Freq: Two times a day (BID) | ORAL | 0 refills | Status: DC

## 2021-05-07 NOTE — Progress Notes (Signed)
E-Visit for Vaginal Symptoms  We are sorry that you are not feeling well. Here is how we plan to help! Based on what you shared with me it looks like you: May have a vaginosis due to bacteria  Vaginosis is an inflammation of the vagina that can result in discharge, itching and pain. The cause is usually a change in the normal balance of vaginal bacteria or an infection. Vaginosis can also result from reduced estrogen levels after menopause.  The most common causes of vaginosis are:   Bacterial vaginosis which results from an overgrowth of one on several organisms that are normally present in your vagina.   Yeast infections which are caused by a naturally occurring fungus called candida.   Vaginal atrophy (atrophic vaginosis) which results from the thinning of the vagina from reduced estrogen levels after menopause.   Trichomoniasis which is caused by a parasite and is commonly transmitted by sexual intercourse.  Factors that increase your risk of developing vaginosis include: Medications, such as antibiotics and steroids Uncontrolled diabetes Use of hygiene products such as bubble bath, vaginal spray or vaginal deodorant Douching Wearing damp or tight-fitting clothing Using an intrauterine device (IUD) for birth control Hormonal changes, such as those associated with pregnancy, birth control pills or menopause Sexual activity Having a sexually transmitted infection  Your treatment plan is Metronidazole or Flagyl 500mg twice a day for 7 days.  I have electronically sent this prescription into the pharmacy that you have chosen.  Be sure to take all of the medication as directed. Stop taking any medication if you develop a rash, tongue swelling or shortness of breath. Mothers who are breast feeding should consider pumping and discarding their breast milk while on these antibiotics. However, there is no consensus that infant exposure at these doses would be harmful.  Remember that  medication creams can weaken latex condoms. .   HOME CARE:  Good hygiene may prevent some types of vaginosis from recurring and may relieve some symptoms:  Avoid baths, hot tubs and whirlpool spas. Rinse soap from your outer genital area after a shower, and dry the area well to prevent irritation. Don't use scented or harsh soaps, such as those with deodorant or antibacterial action. Avoid irritants. These include scented tampons and pads. Wipe from front to back after using the toilet. Doing so avoids spreading fecal bacteria to your vagina.  Other things that may help prevent vaginosis include:  Don't douche. Your vagina doesn't require cleansing other than normal bathing. Repetitive douching disrupts the normal organisms that reside in the vagina and can actually increase your risk of vaginal infection. Douching won't clear up a vaginal infection. Use a latex condom. Both female and female latex condoms may help you avoid infections spread by sexual contact. Wear cotton underwear. Also wear pantyhose with a cotton crotch. If you feel comfortable without it, skip wearing underwear to bed. Yeast thrives in moist environments Your symptoms should improve in the next day or two.  GET HELP RIGHT AWAY IF:  You have pain in your lower abdomen ( pelvic area or over your ovaries) You develop nausea or vomiting You develop a fever Your discharge changes or worsens You have persistent pain with intercourse You develop shortness of breath, a rapid pulse, or you faint.  These symptoms could be signs of problems or infections that need to be evaluated by a medical provider now.  MAKE SURE YOU   Understand these instructions. Will watch your condition. Will get help right   away if you are not doing well or get worse.  Thank you for choosing an e-visit.  Your e-visit answers were reviewed by a board certified advanced clinical practitioner to complete your personal care plan. Depending upon the  condition, your plan could have included both over the counter or prescription medications.  Please review your pharmacy choice. Make sure the pharmacy is open so you can pick up prescription now. If there is a problem, you may contact your provider through MyChart messaging and have the prescription routed to another pharmacy.  Your safety is important to us. If you have drug allergies check your prescription carefully.   For the next 24 hours you can use MyChart to ask questions about today's visit, request a non-urgent call back, or ask for a work or school excuse. You will get an email in the next two days asking about your experience. I hope that your e-visit has been valuable and will speed your recovery.  Approximately 5 minutes was used in reviewing the patient's chart, questionnaire, prescribing medications, and documentation.  

## 2021-05-07 NOTE — Progress Notes (Signed)
Based on what you shared with me it looks like you have vaginal discharge with abd pain and back pian.,that should be evaluated in a face to face office visit. I cannot make a proper diagnosis based on your symptoms. You will need a face to face visit o have a sample taken of your discharge and possible blood work for proper treatment.  NOTE: There will be NO CHARGE for this eVisit   If you are having a true medical emergency please call 911.      For an urgent face to face visit, [facility] has six urgent care centers for your convenience:     [facility] at [facility] Directions [phone] [address] [place], [place] [address]    [facility] Center [facility]) Get Driving Directions [phone] [address] [place], [place] [address]  [facility] Center [facility]) Get Driving Directions [phone] [address] [place],  [place]  [address]  [facility] at [facility] Get Driving Directions [phone] [address], [address] [place], [place] [address]   [facility] Health Urgent Care at [facility] Get Driving Directions  [phone] [address] [place], [place] [address]   [facility] Health Urgent Care at [facility] Directions [phone] [address], [address] [place], [place] [address]  Your MyChart E-visit questionnaire answers were reviewed by a board certified advanced clinical practitioner to complete your personal care plan based on your specific symptoms.  Thank you for using e-Visits.

## 2021-09-09 ENCOUNTER — Telehealth: Payer: Medicaid Other | Admitting: Physician Assistant

## 2021-09-09 DIAGNOSIS — J039 Acute tonsillitis, unspecified: Secondary | ICD-10-CM | POA: Diagnosis not present

## 2021-09-09 MED ORDER — AMOXICILLIN 500 MG PO TABS: 500.0000 mg | ORAL_TABLET | Freq: Two times a day (BID) | ORAL | 0 refills | Status: AC

## 2021-09-09 NOTE — Progress Notes (Signed)

## 2021-09-14 ENCOUNTER — Telehealth: Payer: Medicaid Other | Admitting: Physician Assistant

## 2021-09-14 DIAGNOSIS — H5710 Ocular pain, unspecified eye: Secondary | ICD-10-CM

## 2021-09-14 NOTE — Progress Notes (Signed)
Based on what you shared with me, I feel your condition warrants further evaluation and I recommend that you be seen in a face to face visit. ? ?For scratched cornea, these should be evaluated in person as they can sometimes require different types of eye drops to treat. The eye needs to be visualized so they can determine what is the best treatment.  ?  ?NOTE: There will be NO CHARGE for this eVisit ?  ?If you are having a true medical emergency please call 911.   ?  ? For an urgent face to face visit, [facility] has six urgent care centers for your convenience:  ?  ? [facility] at [facility] ?Get Driving Directions ?[phone] ?[phone] ?[place], [place] [address] ?  ? [facility] Center [facility]) ?Get Driving Directions ?[phone] ?[address] ?[place], [place] [address] ? ?[facility] Health Urgent Care Center [facility]) ?Get Driving Directions ?[phone] ?[address] ?[place],  [place]  [address] ? ?[place] [facility] ?Get Driving Directions ?[phone] ?[address], [address] ?[place], [place] [address] ?  ?[place] [facility] [place] ?Get Driving Directions  ?[phone] ?[address] ?[address] ?[place], [place] [address] ?  ?[place] [facility] at [facility] ?Get Driving Directions ?[phone] ?[address] ?[place], [place] [address] ? ?Your MyChart E-visit questionnaire answers were reviewed by a board certified advanced clinical practitioner to complete your personal care plan based on your specific symptoms.  Thank you for using e-Visits. ?  ? ?I provided 5 minutes of non face-to-face time during this encounter for chart review and documentation.  ? ?

## 2021-09-25 ENCOUNTER — Ambulatory Visit: Payer: Medicaid Other | Admitting: Obstetrics

## 2021-10-02 ENCOUNTER — Encounter: Payer: Self-pay | Admitting: Obstetrics

## 2021-10-02 ENCOUNTER — Ambulatory Visit: Payer: Medicaid Other | Admitting: Obstetrics

## 2021-10-02 ENCOUNTER — Other Ambulatory Visit (HOSPITAL_COMMUNITY)
Admission: RE | Admit: 2021-10-02 | Discharge: 2021-10-02 | Disposition: A | Discharge status: Home / Self Care | Payer: Medicaid Other | Source: Ambulatory Visit | Attending: Obstetrics | Admitting: Obstetrics

## 2021-10-02 VITALS — BP 129/91 | HR 74 | Ht 65.0 in | Wt 147.0 lb

## 2021-10-02 DIAGNOSIS — N898 Other specified noninflammatory disorders of vagina: Secondary | ICD-10-CM

## 2021-10-02 DIAGNOSIS — Z113 Encounter for screening for infections with a predominantly sexual mode of transmission: Secondary | ICD-10-CM

## 2021-10-02 DIAGNOSIS — Z01419 Encounter for gynecological examination (general) (routine) without abnormal findings: Secondary | ICD-10-CM | POA: Diagnosis not present

## 2021-10-02 NOTE — Progress Notes (Signed)
Patient ID: [name] [name], female   DOB: 24-Jan-1981, 41 y.o.   MRN: [id number] ? ?Chief Complaint  ?Patient presents with  ? Gynecologic Exam  ? ? ?HPI ?[name] [name] is a 41 y.o. female.  Complains of pain in bladder area after urinating. ?HPI ? ?Past Medical History:  ?Diagnosis Date  ? Asthma   ? childhood - no problems as adult, no inhaler  ? History of blood transfusion 2005  ? [place] with c/s surgery  ? Seasonal allergies   ? ? ?Past Surgical History:  ?Procedure Laterality Date  ? BILATERAL SALPINGECTOMY Bilateral 08/04/2013  ? Procedure: BILATERAL SALPINGECTOMY;  Surgeon: Lahoma Crocker, MD;  Location: [facility];  Service: Gynecology;  Laterality: Bilateral;  ? CESAREAN SECTION    ? x 3  ? DILITATION & CURRETTAGE/HYSTROSCOPY WITH NOVASURE ABLATION N/A 03/31/2013  ? Procedure: DILATATION & CURETTAGE/HYSTEROSCOPY WITH Attempted NOVASURE ABLATION;  Surgeon: Lahoma Crocker, MD;  Location: [facility];  Service: Gynecology;  Laterality: N/A;  ? ROBOTIC ASSISTED TOTAL HYSTERECTOMY N/A 08/04/2013  ? Procedure: ROBOTIC ASSISTED TOTAL HYSTERECTOMY;  Surgeon: Lahoma Crocker, MD;  Location: [facility];  Service: Gynecology;  Laterality: N/A;  ? TUBAL LIGATION    ? WISDOM TOOTH EXTRACTION    ? ? ?Family History  ?Problem Relation Age of Onset  ? Stroke Maternal Grandmother   ? Hypertension Maternal Grandmother   ? Diabetes Maternal Grandmother   ? Diabetes Mother   ? Hypertension Mother   ? Hypertension Sister   ? Diabetes Maternal Grandfather   ? Breast cancer Maternal Aunt   ? ? ?Social History ?Social History  ? ?Tobacco Use  ? Smoking status: Never  ? Smokeless tobacco: Never  ?Substance Use Topics  ? Alcohol use: Yes  ?  Comment: socially  ? Drug use: No  ? ? ?No Known Allergies ? ?Current Outpatient Medications  ?Medication Sig Dispense Refill  ? metroNIDAZOLE (FLAGYL) 500 MG tablet Take 1 tablet (500 mg total) by mouth 2 (two) times daily. 14 tablet 0  ? methocarbamol (ROBAXIN) 500 MG tablet Take 1 tablet (500 mg  total) by mouth 2 (two) times daily as needed for muscle spasms. (Patient not taking: Reported on 10/02/2021) 20 tablet 0  ? naproxen (NAPROSYN) 500 MG tablet Take 1 tablet (500 mg total) by mouth 2 (two) times daily with a meal. (Patient not taking: Reported on 10/02/2021) 30 tablet 0  ? ?No current facility-administered medications for this visit.  ? ? ?Review of Systems ?Review of Systems ?Constitutional: negative for fatigue and weight loss ?Respiratory: negative for cough and wheezing ?Cardiovascular: negative for chest pain, fatigue and palpitations ?Gastrointestinal: negative for abdominal pain and change in bowel habits ?Genitourinary:positive for vaginal discharge and pelvic pain and spasns after urinating ?Integument/breast: negative for nipple discharge ?Musculoskeletal:negative for myalgias ?Neurological: negative for gait problems and tremors ?Behavioral/Psych: negative for abusive relationship, depression ?Endocrine: negative for temperature intolerance    ?  ?Blood pressure (!) 129/91, pulse 74, height 5\' 5"  (1.651 m), weight 147 lb (66.7 kg), last menstrual period 07/30/2013. ? ?Physical Exam ?Physical Exam ?General:   Alert and no distress  ?Skin:   no rash or abnormalities  ?Lungs:   clear to auscultation bilaterally  ?Heart:   regular rate and rhythm, S1, S2 normal, no murmur, click, rub or gallop  ?Breasts:   normal without suspicious masses, skin or nipple changes or axillary nodes  ?Abdomen:  normal findings: no organomegaly, soft, non-tender and no hernia  ?Pelvis:  External genitalia: normal  general appearance ?Urinary system: urethral meatus normal and bladder without fullness, nontender ?Vaginal: normal without tenderness, induration or masses ?Cervix: absent ?Adnexa: normal bimanual exam ?Uterus: absent  ?  ?I have spent a total of 20 minutes of face-to-face time, excluding clinical staff time, reviewing notes and preparing to see patient, ordering tests and/or medications, and counseling the  patient.  ? ?Data Reviewed ?Labs ? ?Assessment  ?   ?1. Encounter for gynecological examination ?Rx: ?- Comprehensive metabolic panel ?- TSH ?- Ferritin ?- CBC ?- Urine Culture ?- POCT urinalysis dipstick ?- Hemoglobin A1c ? ?2. Vaginal discharge ?Rx: ?- Cervicovaginal ancillary only( [place]) ? ?3. Screen for STD (sexually transmitted disease) ?Rx: ?- HIV Antibody (routine testing w rflx) ?- Hepatitis B surface antigen ?- RPR ?- Hepatitis C antibody   ?  ? ?Plan ?  Follow up in 1 year ? ?Orders Placed This Encounter  ?Procedures  ? Urine Culture  ? Comprehensive metabolic panel  ? TSH  ? Ferritin  ? CBC  ? HIV Antibody (routine testing w rflx)  ? Hepatitis B surface antigen  ? RPR  ? Hepatitis C antibody  ? Hemoglobin A1c  ? POCT urinalysis dipstick  ? ? ? ? Shelly Bombard, MD ?10/03/2021 1:51 PM  ?

## 2021-10-02 NOTE — Progress Notes (Signed)
Reports pain at hysterectomy incision. (2015) ?Reports fatigue. ?Reports frequent BV. (Taking Flagyl X 3-4 days) ?Reports back pain. ? ?

## 2021-10-03 LAB — TSH: TSH: 0.578 u[IU]/mL (ref 0.450–4.500)

## 2021-10-03 LAB — COMPREHENSIVE METABOLIC PANEL
ALT: 8 IU/L (ref 0–32)
AST: 14 IU/L (ref 0–40)
Albumin/Globulin Ratio: 1.7 (ref 1.2–2.2)
Albumin: 4.2 g/dL (ref 3.8–4.8)
Alkaline Phosphatase: 48 IU/L (ref 44–121)
BUN/Creatinine Ratio: 12 (ref 9–23)
BUN: 14 mg/dL (ref 6–20)
Bilirubin Total: <0.2 mg/dL (ref 0.0–1.2)
CO2: 22 mmol/L (ref 20–29)
Calcium: 9.2 mg/dL (ref 8.7–10.2)
Chloride: 102 mmol/L (ref 96–106)
Creatinine, Ser: 1.16 mg/dL — ABNORMAL HIGH (ref 0.57–1.00)
Globulin, Total: 2.5 g/dL (ref 1.5–4.5)
Glucose: 81 mg/dL (ref 70–99)
Potassium: 4.6 mmol/L (ref 3.5–5.2)
Sodium: 136 mmol/L (ref 134–144)
Total Protein: 6.7 g/dL (ref 6.0–8.5)
eGFR: 62 mL/min/{1.73_m2} (ref >59)

## 2021-10-03 LAB — CERVICOVAGINAL ANCILLARY ONLY
Bacterial Vaginitis (gardnerella): POSITIVE — AB
Candida Glabrata: NEGATIVE
Candida Vaginitis: POSITIVE — AB
Comment: NEGATIVE
Comment: NEGATIVE
Comment: NEGATIVE
Comment: NEGATIVE
Trichomonas: NEGATIVE

## 2021-10-03 LAB — FERRITIN: Ferritin: 116 ng/mL (ref 15–150)

## 2021-10-03 LAB — CBC
Hematocrit: 37.8 % (ref 34.0–46.6)
Hemoglobin: 12.7 g/dL (ref 11.1–15.9)
MCH: 31 pg (ref 26.6–33.0)
MCHC: 33.6 g/dL (ref 31.5–35.7)
MCV: 92 fL (ref 79–97)
Platelets: 253 10*3/uL (ref 150–450)
RBC: 4.1 x10E6/uL (ref 3.77–5.28)
RDW: 13 % (ref 11.7–15.4)
WBC: 5.9 10*3/uL (ref 3.4–10.8)

## 2021-10-03 LAB — HEMOGLOBIN A1C
Est. average glucose Bld gHb Est-mCnc: 108 mg/dL
Hgb A1c MFr Bld: 5.4 % (ref 4.8–5.6)

## 2021-10-03 LAB — HEPATITIS B SURFACE ANTIGEN: Hepatitis B Surface Ag: NEGATIVE

## 2021-10-03 LAB — HEPATITIS C ANTIBODY: Hep C Virus Ab: NONREACTIVE

## 2021-10-03 LAB — HIV ANTIBODY (ROUTINE TESTING W REFLEX): HIV Screen 4th Generation wRfx: NONREACTIVE

## 2021-10-03 LAB — RPR: RPR Ser Ql: NONREACTIVE

## 2021-10-05 LAB — URINE CULTURE

## 2021-10-14 ENCOUNTER — Ambulatory Visit: Payer: Medicaid Other | Admitting: Obstetrics

## 2021-10-30 ENCOUNTER — Ambulatory Visit: Payer: Medicaid Other | Admitting: Obstetrics

## 2022-03-05 ENCOUNTER — Telehealth: Payer: Medicaid Other | Admitting: Physician Assistant

## 2022-03-05 DIAGNOSIS — J358 Other chronic diseases of tonsils and adenoids: Secondary | ICD-10-CM | POA: Diagnosis not present

## 2022-03-05 DIAGNOSIS — K219 Gastro-esophageal reflux disease without esophagitis: Secondary | ICD-10-CM

## 2022-03-05 MED ORDER — OMEPRAZOLE 20 MG PO CPDR: 20.0000 mg | DELAYED_RELEASE_CAPSULE | Freq: Every day | ORAL | 0 refills | Status: DC

## 2022-03-05 NOTE — Progress Notes (Signed)
E-Visit for Heartburn  We are sorry that you are not feeling well.  Here is how we plan to help!  Based on what you shared with me it looks like you most likely have Gastroesophageal Reflux Disease (GERD)  Gastroesophageal reflux disease (GERD) happens when acid from your stomach flows up into the esophagus.  When acid comes in contact with the esophagus, the acid causes sorenss (inflammation) in the esophagus.  Over time, GERD may create small holes (ulcers) in the lining of the esophagus.  It is possible you may have two things going on: Heartburn (Acid reflux or gastroesophageal reflux disease) can cause burping, indigestion, coughing, and hoarseness. Tonsillar stones, which you reported already, can cause the swelling and tenderness over the tonsils and salivary glands, as well as the foul breath. This is normally more related to dehydration. Make sure to push fluids and stay well hydrated to decrease development of tonsillar stones.  I have prescribed Omeprazole 20 mg one by mouth daily until you follow up with a provider.  Your symptoms should improve in the next day or two.  You can use antacids as needed until symptoms resolve.  Call us if your heartburn worsens, you have trouble swallowing, weight loss, spitting up blood or recurrent vomiting.  Home Care: May include lifestyle changes such as weight loss, quitting smoking and alcohol consumption Avoid foods and drinks that make your symptoms worse, such as: Caffeine or alcoholic drinks Chocolate Peppermint or mint flavorings Garlic and onions Spicy foods Citrus fruits, such as oranges, lemons, or limes Tomato-based foods such as sauce, chili, salsa and pizza Fried and fatty foods Avoid lying down for 3 hours prior to your bedtime or prior to taking a nap Eat small, frequent meals instead of a large meals Wear loose-fitting clothing.  Do not wear anything tight around your waist that causes pressure on your stomach. Raise the head  of your bed 6 to 8 inches with wood blocks to help you sleep.  Extra pillows will not help.  Seek Help Right Away If: You have pain in your arms, neck, jaw, teeth or back Your pain increases or changes in intensity or duration You develop nausea, vomiting or sweating (diaphoresis) You develop shortness of breath or you faint Your vomit is green, yellow, black or looks like coffee grounds or blood Your stool is red, bloody or black  These symptoms could be signs of other problems, such as heart disease, gastric bleeding or esophageal bleeding.  Make sure you : Understand these instructions. Will watch your condition. Will get help right away if you are not doing well or get worse.  Your e-visit answers were reviewed by a board certified advanced clinical practitioner to complete your personal care plan.  Depending on the condition, your plan could have included both over the counter or prescription medications.  If there is a problem please reply  once you have received a response from your provider.  Your safety is important to [place].  If you have drug allergies check your prescription carefully.    You can use MyChart to ask questions about today's visit, request a non-urgent call back, or ask for a work or school excuse for 24 hours related to this e-Visit. If it has been greater than 24 hours you will need to follow up with your provider, or enter a new e-Visit to address those concerns.  You will get an e-mail in the next two days asking about your experience.  I hope that  your e-visit has been valuable and will speed your recovery. Thank you for using e-visits.  I provided 5 minutes of non face-to-face time during this encounter for chart review and documentation.   

## 2022-03-30 IMAGING — CR DG LUMBAR SPINE COMPLETE 4+V
5 series · 5 of 5 positions shown · non-contrast
Comparison: None.

CLINICAL DATA: Low back pain

EXAM:
LUMBAR SPINE - COMPLETE 4+ VIEW

[t lumbar spine ap]
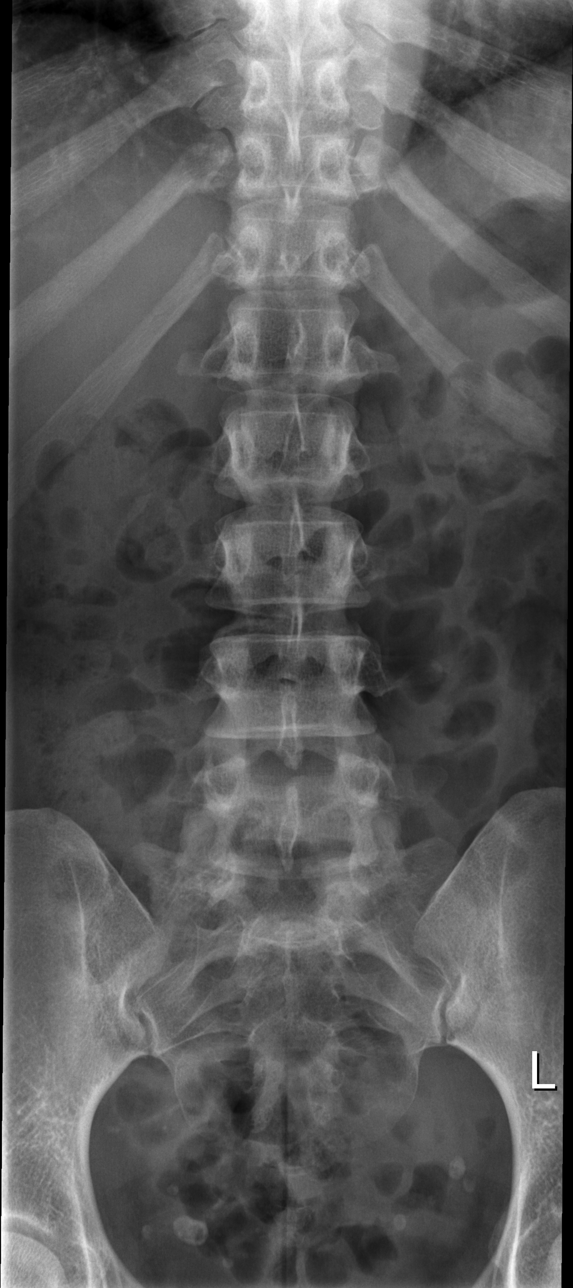

[t lumbar spine obl (1 of 2)]
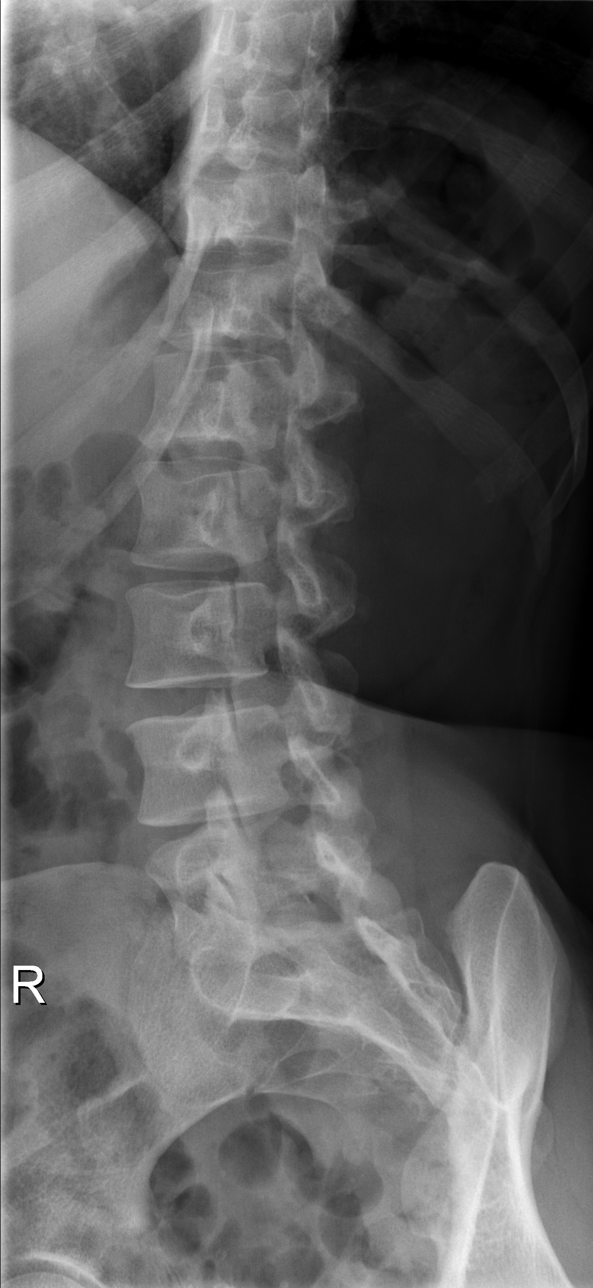

[t lumbar spine obl (2 of 2)]
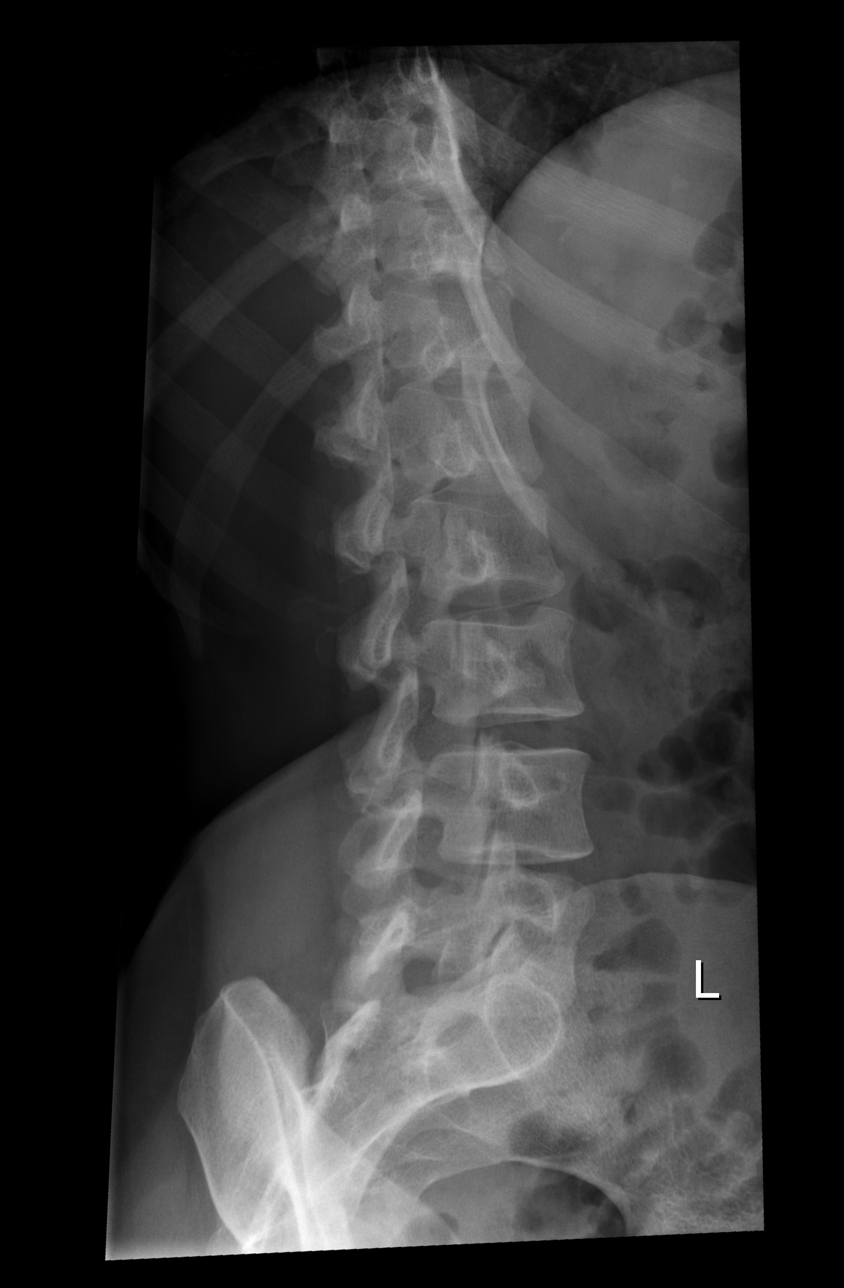

[t lumbar spine lat]
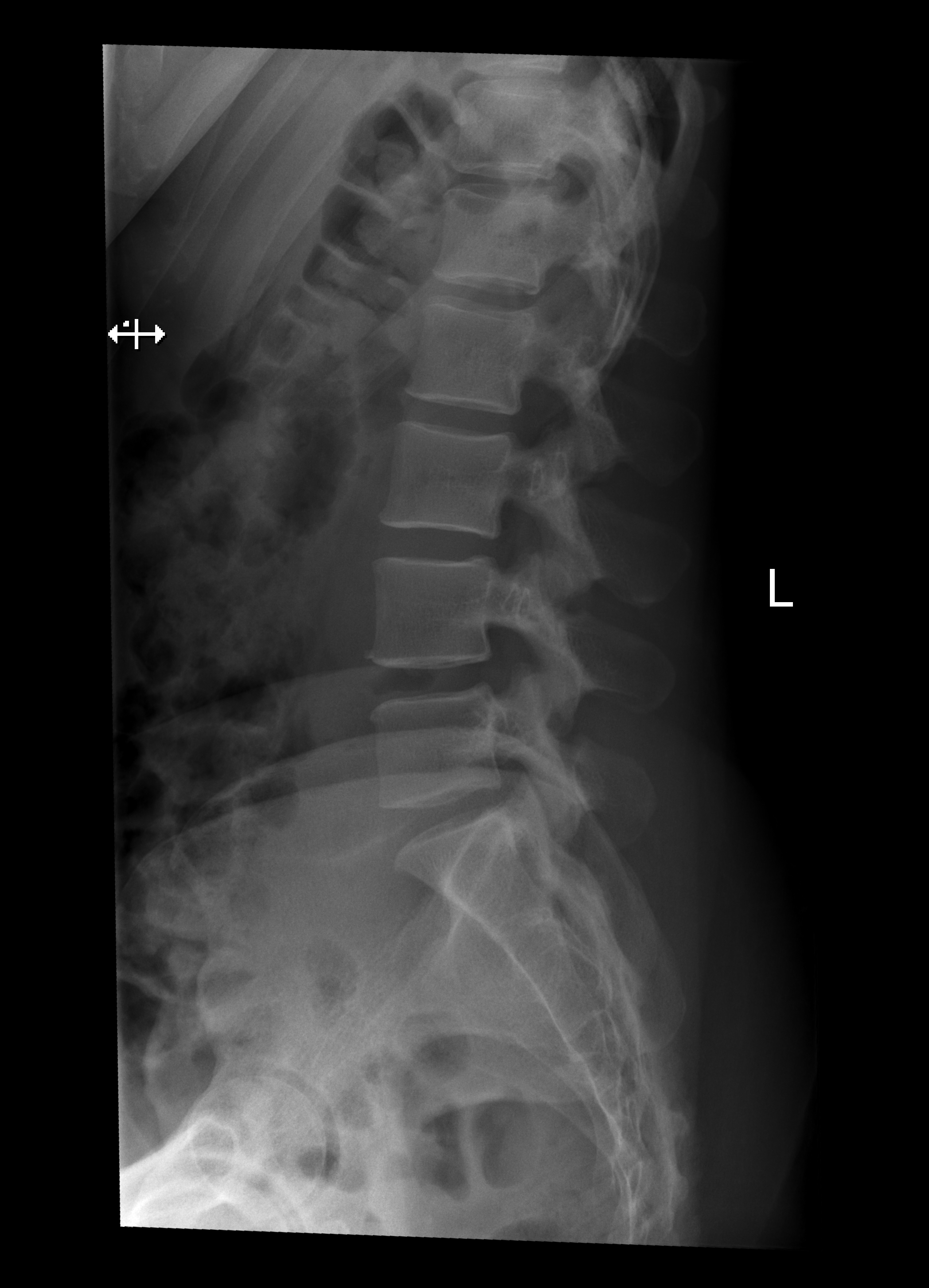

[t lumbar l-5 s-1 spot]
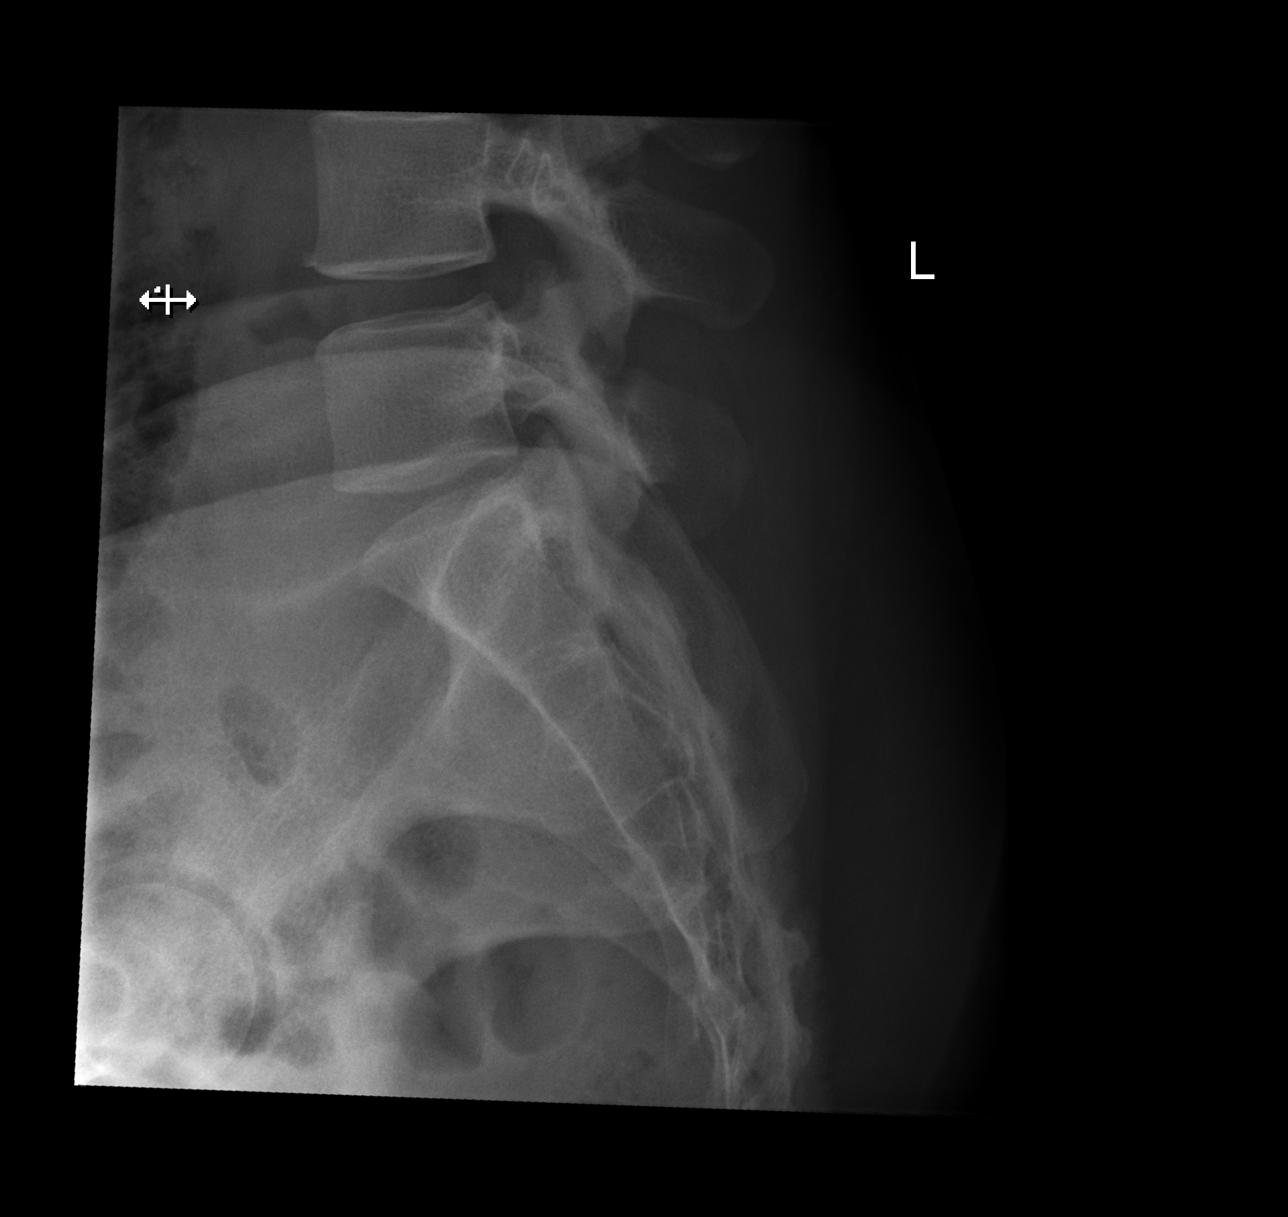

[5 of 5 positions shown; findings below may reference images not displayed]

FINDINGS: Five lumbar type vertebral bodies are well visualized. Vertebral
body height is well maintained. No pars defects are noted. No
anterolisthesis is seen. Mild disc space narrowing is noted at L4-5
with mild osteophytic changes. No soft tissue abnormality is seen.
IMPRESSION: Mild degenerative change without acute abnormality.

## 2022-07-03 ENCOUNTER — Telehealth: Payer: Medicaid Other | Admitting: Family Medicine

## 2022-07-03 DIAGNOSIS — K219 Gastro-esophageal reflux disease without esophagitis: Secondary | ICD-10-CM

## 2022-07-03 NOTE — Progress Notes (Signed)
Needs to have in person follow up for possible needs of testing and medication adjustment and follow up.  [place]

## 2022-08-28 ENCOUNTER — Other Ambulatory Visit: Payer: Self-pay | Admitting: Internal Medicine

## 2022-08-31 LAB — COMPLETE METABOLIC PANEL WITH GFR
AG Ratio: 1.7 (calc) (ref 1.0–2.5)
ALT: 8 U/L (ref 6–29)
AST: 14 U/L (ref 10–30)
Albumin: 4 g/dL (ref 3.6–5.1)
Alkaline phosphatase (APISO): 38 U/L (ref 31–125)
BUN/Creatinine Ratio: 15 (calc) (ref 6–22)
BUN: 15 mg/dL (ref 7–25)
CO2: 21 mmol/L (ref 20–32)
Calcium: 9 mg/dL (ref 8.6–10.2)
Chloride: 108 mmol/L (ref 98–110)
Creat: 1.01 mg/dL — ABNORMAL HIGH (ref 0.50–0.99)
Globulin: 2.4 g/dL (calc) (ref 1.9–3.7)
Glucose, Bld: 75 mg/dL (ref 65–99)
Potassium: 4.2 mmol/L (ref 3.5–5.3)
Sodium: 140 mmol/L (ref 135–146)
Total Bilirubin: 0.3 mg/dL (ref 0.2–1.2)
Total Protein: 6.4 g/dL (ref 6.1–8.1)
eGFR: 72 mL/min/{1.73_m2} (ref >=60)

## 2022-08-31 LAB — HELICOBACTER PYLORI  SPECIAL ANTIGEN

## 2022-08-31 LAB — CBC
HCT: 37.8 % (ref 35.0–45.0)
Hemoglobin: 13 g/dL (ref 11.7–15.5)
MCH: 31.6 pg (ref 27.0–33.0)
MCHC: 34.4 g/dL (ref 32.0–36.0)
MCV: 92 fL (ref 80.0–100.0)
MPV: 10.4 fL (ref 7.5–12.5)
Platelets: 250 10*3/uL (ref 140–400)
RBC: 4.11 10*6/uL (ref 3.80–5.10)
RDW: 13 % (ref 11.0–15.0)
WBC: 5.4 10*3/uL (ref 3.8–10.8)

## 2022-08-31 LAB — LIPID PANEL
Cholesterol: 220 mg/dL — ABNORMAL HIGH (ref <200)
HDL: 68 mg/dL (ref >=50)
LDL Cholesterol (Calc): 135 mg/dL (calc) — ABNORMAL HIGH
Non-HDL Cholesterol (Calc): 152 mg/dL (calc) — ABNORMAL HIGH (ref <130)
Total CHOL/HDL Ratio: 3.2 (calc) (ref <5.0)
Triglycerides: 71 mg/dL (ref <150)

## 2022-08-31 LAB — VITAMIN D 25 HYDROXY (VIT D DEFICIENCY, FRACTURES): Vit D, 25-Hydroxy: 12 ng/mL — ABNORMAL LOW (ref 30–100)

## 2022-10-30 ENCOUNTER — Encounter (HOSPITAL_BASED_OUTPATIENT_CLINIC_OR_DEPARTMENT_OTHER): Payer: Self-pay | Admitting: Emergency Medicine

## 2022-10-30 ENCOUNTER — Other Ambulatory Visit: Payer: Self-pay

## 2022-10-30 ENCOUNTER — Emergency Department (HOSPITAL_BASED_OUTPATIENT_CLINIC_OR_DEPARTMENT_OTHER)
Admission: EM | Admit: 2022-10-30 | Discharge: 2022-10-30 | Discharge status: Against Medical Advice | Payer: Medicaid Other | Attending: Emergency Medicine | Admitting: Emergency Medicine

## 2022-10-30 DIAGNOSIS — R109 Unspecified abdominal pain: Secondary | ICD-10-CM | POA: Insufficient documentation

## 2022-10-30 DIAGNOSIS — R200 Anesthesia of skin: Secondary | ICD-10-CM | POA: Insufficient documentation

## 2022-10-30 DIAGNOSIS — Z5321 Procedure and treatment not carried out due to patient leaving prior to being seen by health care provider: Secondary | ICD-10-CM | POA: Insufficient documentation

## 2022-10-30 LAB — URINALYSIS, ROUTINE W REFLEX MICROSCOPIC
Bilirubin Urine: NEGATIVE
Glucose, UA: NEGATIVE mg/dL
Hgb urine dipstick: NEGATIVE
Ketones, ur: NEGATIVE mg/dL
Leukocytes,Ua: NEGATIVE
Nitrite: NEGATIVE
Protein, ur: NEGATIVE mg/dL
Specific Gravity, Urine: >=1.03 (ref 1.005–1.030)
pH: 5.5 (ref 5.0–8.0)

## 2022-10-30 LAB — CBC
HCT: 38.5 % (ref 36.0–46.0)
Hemoglobin: 13 g/dL (ref 12.0–15.0)
MCH: 31.1 pg (ref 26.0–34.0)
MCHC: 33.8 g/dL (ref 30.0–36.0)
MCV: 92.1 fL (ref 80.0–100.0)
Platelets: 261 10*3/uL (ref 150–400)
RBC: 4.18 MIL/uL (ref 3.87–5.11)
RDW: 12.9 % (ref 11.5–15.5)
WBC: 6.6 10*3/uL (ref 4.0–10.5)
nRBC: 0 % (ref 0.0–0.2)

## 2022-10-30 LAB — COMPREHENSIVE METABOLIC PANEL
ALT: 12 U/L (ref 0–44)
AST: 18 U/L (ref 15–41)
Albumin: 3.9 g/dL (ref 3.5–5.0)
Alkaline Phosphatase: 39 U/L (ref 38–126)
Anion gap: 8 (ref 5–15)
BUN: 17 mg/dL (ref 6–20)
CO2: 24 mmol/L (ref 22–32)
Calcium: 8.5 mg/dL — ABNORMAL LOW (ref 8.9–10.3)
Chloride: 105 mmol/L (ref 98–111)
Creatinine, Ser: 1.21 mg/dL — ABNORMAL HIGH (ref 0.44–1.00)
GFR, Estimated: 58 mL/min — ABNORMAL LOW (ref >60)
Glucose, Bld: 105 mg/dL — ABNORMAL HIGH (ref 70–99)
Potassium: 3.6 mmol/L (ref 3.5–5.1)
Sodium: 137 mmol/L (ref 135–145)
Total Bilirubin: 0.5 mg/dL (ref 0.3–1.2)
Total Protein: 7.1 g/dL (ref 6.5–8.1)

## 2022-10-30 LAB — LIPASE, BLOOD: Lipase: 28 U/L (ref 11–51)

## 2022-10-30 NOTE — ED Triage Notes (Signed)
Pt states she has abdominal pain for 1.5 weeks.  Also c/o right side pain.  Pt relates her hand was numb and she felt dizzy today.  Last BM this am.  No nausea, no vomiting.  Pt also relates she has soreness to her forehead and can't concentrate.  Pt states she is stressed as her mother died two days ago.  Pt admits to being stressed out and not eating well.

## 2023-01-09 ENCOUNTER — Other Ambulatory Visit: Payer: Self-pay

## 2023-01-09 ENCOUNTER — Encounter (HOSPITAL_BASED_OUTPATIENT_CLINIC_OR_DEPARTMENT_OTHER): Payer: Self-pay | Admitting: Emergency Medicine

## 2023-01-09 ENCOUNTER — Emergency Department (HOSPITAL_BASED_OUTPATIENT_CLINIC_OR_DEPARTMENT_OTHER)
Admission: EM | Admit: 2023-01-09 | Discharge: 2023-01-09 | Disposition: A | Discharge status: Home / Self Care | Payer: Medicaid Other | Attending: Emergency Medicine | Admitting: Emergency Medicine

## 2023-01-09 DIAGNOSIS — R531 Weakness: Secondary | ICD-10-CM | POA: Diagnosis not present

## 2023-01-09 DIAGNOSIS — R002 Palpitations: Secondary | ICD-10-CM | POA: Insufficient documentation

## 2023-01-09 LAB — CBC WITH DIFFERENTIAL/PLATELET
Abs Immature Granulocytes: 0.02 10*3/uL (ref 0.00–0.07)
Basophils Absolute: 0 10*3/uL (ref 0.0–0.1)
Basophils Relative: 0 %
Eosinophils Absolute: 0.2 10*3/uL (ref 0.0–0.5)
Eosinophils Relative: 3 %
HCT: 36.1 % (ref 36.0–46.0)
Hemoglobin: 12.3 g/dL (ref 12.0–15.0)
Immature Granulocytes: 0 %
Lymphocytes Relative: 30 %
Lymphs Abs: 2.1 10*3/uL (ref 0.7–4.0)
MCH: 31.1 pg (ref 26.0–34.0)
MCHC: 34.1 g/dL (ref 30.0–36.0)
MCV: 91.4 fL (ref 80.0–100.0)
Monocytes Absolute: 0.6 10*3/uL (ref 0.1–1.0)
Monocytes Relative: 8 %
Neutro Abs: 4 10*3/uL (ref 1.7–7.7)
Neutrophils Relative %: 59 %
Platelets: 228 10*3/uL (ref 150–400)
RBC: 3.95 MIL/uL (ref 3.87–5.11)
RDW: 12.7 % (ref 11.5–15.5)
WBC: 6.8 10*3/uL (ref 4.0–10.5)
nRBC: 0 % (ref 0.0–0.2)

## 2023-01-09 LAB — BASIC METABOLIC PANEL
Anion gap: 7 (ref 5–15)
BUN: 18 mg/dL (ref 6–20)
CO2: 26 mmol/L (ref 22–32)
Calcium: 8.9 mg/dL (ref 8.9–10.3)
Chloride: 105 mmol/L (ref 98–111)
Creatinine, Ser: 1.2 mg/dL — ABNORMAL HIGH (ref 0.44–1.00)
GFR, Estimated: 59 mL/min — ABNORMAL LOW (ref >60)
Glucose, Bld: 102 mg/dL — ABNORMAL HIGH (ref 70–99)
Potassium: 3.7 mmol/L (ref 3.5–5.1)
Sodium: 138 mmol/L (ref 135–145)

## 2023-01-09 LAB — MAGNESIUM: Magnesium: 2.1 mg/dL (ref 1.7–2.4)

## 2023-01-09 LAB — CBG MONITORING, ED: Glucose-Capillary: 85 mg/dL (ref 70–99)

## 2023-01-09 MED ORDER — SODIUM CHLORIDE 0.9 % IV BOLUS
1000.0000 mL | Freq: Once | INTRAVENOUS | Status: AC
  Administered 2023-01-09: 1000 mL via INTRAVENOUS

## 2023-01-09 NOTE — ED Triage Notes (Signed)
Pt in with reported faint feeling first noticed when she was hanging some pictures this evening. Pt states she felt her heart begin to race, sat down on the couch and began to have blurry vision and near syncope. Denies any cp or sob at this time. Pt states she hasn't eaten since lunch time

## 2023-01-09 NOTE — ED Provider Notes (Signed)
EMERGENCY DEPARTMENT AT [facility]  Provider Note  CSN: [id number] Arrival date & time: 01/09/23 0139  History Chief Complaint  Patient presents with   Weakness    [name] [name] is a 42 y.o. female with history of GERD but otherwise health presents for feeling generally weak. She reports she woke up this morning with some URI symptoms but was able to go to work during the day. She moved into a new apartment recently and was hanging pictures tonight when she began to feel poorly then but got worse just prior to arrival. She reports she felt like her heart was racing and nearly passed out. She reports she has been under increased stress recently dealing with her mother's death and moving. She did not eat anything after lunchtime today.    Home Medications Prior to Admission medications   Medication Sig Start Date End Date Taking? Authorizing Provider  methocarbamol (ROBAXIN) 500 MG tablet Take 1 tablet (500 mg total) by mouth 2 (two) times daily as needed for muscle spasms. Patient not taking: Reported on 10/02/2021 10/21/20   Muthersbaugh, Dahlia Client, PA-C  metroNIDAZOLE (FLAGYL) 500 MG tablet Take 1 tablet (500 mg total) by mouth 2 (two) times daily. 05/07/21   Roxy Horseman, PA-C  naproxen (NAPROSYN) 500 MG tablet Take 1 tablet (500 mg total) by mouth 2 (two) times daily with a meal. Patient not taking: Reported on 10/02/2021 10/21/20   Muthersbaugh, Dahlia Client, PA-C  omeprazole (PRILOSEC) 20 MG capsule Take 1 capsule (20 mg total) by mouth daily. 03/05/22   [name], PA-C  cetirizine (ZYRTEC) 10 MG tablet TK 1 T PO QPM PRF ALLERGIES Patient not taking: Reported on 01/01/2020 01/26/19 10/17/20  [provider]     Allergies    Patient has no known allergies.   Review of Systems   Review of Systems Please see HPI for pertinent positives and negatives  Physical Exam BP (!) 143/89   Pulse 68   Temp 97.8 F (36.6 C) (Oral)   Resp 14   Wt 72.6 kg    LMP 07/30/2013   SpO2 100%   BMI 26.63 kg/m   Physical Exam Vitals and nursing note reviewed.  Constitutional:      Appearance: Normal appearance.  HENT:     Head: Normocephalic and atraumatic.     Nose: Nose normal.     Mouth/Throat:     Mouth: Mucous membranes are moist.  Eyes:     Extraocular Movements: Extraocular movements intact.     Conjunctiva/sclera: Conjunctivae normal.  Cardiovascular:     Rate and Rhythm: Normal rate. Rhythm irregular.  Pulmonary:     Effort: Pulmonary effort is normal.     Breath sounds: Normal breath sounds.  Abdominal:     General: Abdomen is flat.     Palpations: Abdomen is soft.     Tenderness: There is no abdominal tenderness.  Musculoskeletal:        General: No swelling. Normal range of motion.     Cervical back: Neck supple.  Skin:    General: Skin is warm and dry.  Neurological:     General: No focal deficit present.     Mental Status: She is alert.  Psychiatric:        Mood and Affect: Mood normal.     ED Results / Procedures / Treatments   EKG EKG Interpretation Date/Time:  Saturday January 09 2023 02:35:13 EDT Ventricular Rate:  66 PR Interval:  153 QRS Duration:  77 QT Interval:  400 QTC Calculation: 420 R Axis:   66  Text Interpretation: Sinus rhythm ST elev, probable normal early repol pattern No significant change since last tracing Confirmed by Susy Frizzle [phone]) on 01/09/2023 2:36:27 AM  Procedures Procedures  Medications Ordered in the ED Medications  sodium chloride 0.9 % bolus 1,000 mL (1,000 mLs Intravenous New Bag/Given 01/09/23 0243)    Initial Impression and Plan  Patient here with nonspecific generalized weakness and heart racing. She did not have dinner tonight, so consider hypoglycemia. She also has a history of anemia, although has had hysterectomy so no recent blood loss. She has irregular heart rate on exam, likely PVCs, will check labs and EKG. Place on cardiac monitor and give IVF.   ED  Course   Clinical Course as of 01/09/23 0337  Sat Jan 09, 2023  0246 CBG is normal [CS]  0253 CBC is normal.  [CS]  0324 BMP with mildly elevated Cr, but not significantly changed from previous. Magnesium is normal.  [CS]  [id number] Patient remains well appearing, has been up to the bathroom without dizziness. Recommend she get more rest, ensure she is eating regular meals and advise PCP follow up, RTED for any other concerns.   [CS]    Clinical Course User Index [CS] Pollyann Savoy, MD     MDM Rules/Calculators/A&P Medical Decision Making Problems Addressed: Generalized weakness: acute illness or injury Palpitations: acute illness or injury  Amount and/or Complexity of Data Reviewed Labs: ordered. Decision-making details documented in ED Course. ECG/medicine tests: ordered and independent interpretation performed. Decision-making details documented in ED Course.     Final Clinical Impression(s) / ED Diagnoses Final diagnoses:  Generalized weakness  Palpitations    Rx / DC Orders ED Discharge Orders     None        Pollyann Savoy, MD 01/09/23 [phone]

## 2023-01-09 NOTE — ED Notes (Signed)
Reviewed AVS with patient, patient expressed understanding of directions, denies further questions at this time. 

## 2023-04-21 ENCOUNTER — Telehealth: Payer: Medicaid Other | Admitting: Physician Assistant

## 2023-04-21 DIAGNOSIS — Z91199 Patient's noncompliance with other medical treatment and regimen due to unspecified reason: Secondary | ICD-10-CM

## 2023-04-21 DIAGNOSIS — R09A2 Foreign body sensation, throat: Secondary | ICD-10-CM

## 2023-04-21 DIAGNOSIS — K219 Gastro-esophageal reflux disease without esophagitis: Secondary | ICD-10-CM

## 2023-04-21 DIAGNOSIS — J029 Acute pharyngitis, unspecified: Secondary | ICD-10-CM

## 2023-04-21 MED ORDER — PANTOPRAZOLE SODIUM 40 MG PO TBEC
40.0000 mg | DELAYED_RELEASE_TABLET | Freq: Every day | ORAL | 0 refills | Status: DC
Start: 2023-04-21 — End: 2023-10-08

## 2023-04-21 NOTE — Progress Notes (Signed)
Virtual Visit Consent   [name] [name], you are scheduled for a virtual visit with a [facility] provider today. Just as with appointments in the office, your consent must be obtained to participate. Your consent will be active for this visit and any virtual visit you may have with one of our providers in the next 365 days. If you have a MyChart account, a copy of this consent can be sent to you electronically.  As this is a virtual visit, video technology does not allow for your provider to perform a traditional examination. This may limit your provider's ability to fully assess your condition. If your provider identifies any concerns that need to be evaluated in person or the need to arrange testing (such as labs, EKG, etc.), we will make arrangements to do so. Although advances in technology are sophisticated, we cannot ensure that it will always work on either your end or our end. If the connection with a video visit is poor, the visit may have to be switched to a telephone visit. With either a video or telephone visit, we are not always able to ensure that we have a secure connection.  By engaging in this virtual visit, you consent to the provision of healthcare and authorize for your insurance to be billed (if applicable) for the services provided during this visit. Depending on your insurance coverage, you may receive a charge related to this service.  I need to obtain your verbal consent now. Are you willing to proceed with your visit today? [name] [name] has provided verbal consent on 04/21/2023 for a virtual visit (video or telephone). [name] Loveless, PA-C  Date: 04/21/2023 4:58 PM  Virtual Visit via Video Note   IMargaretann [name], [name]  ([id number], 12/20/1980) on 04/21/23 at  4:45 PM EST by a video-enabled telemedicine application and verified that I am speaking with the correct person using two identifiers.  Location: Patient: Virtual  Visit Location Patient: Home Provider: Virtual Visit Location Provider: Home Office   I discussed the limitations of evaluation and management by telemedicine and the availability of in person appointments. The patient expressed understanding and agreed to proceed.    History of Present Illness: [name] [name] is a 42 y.o. who identifies as a female who was assigned female at birth, and is being seen today for GERD and tonsil stones. Has history of GERD that causes sore throat. Has been on Omeprazole 20mg  in the past.   Of recent has been having fullness in her throat and globus sensation. Has been having epigastric pain. Reports these symptoms make her develop tonsillar stones more frequently.   Did not have any improvements in symptoms with Omeprazole previously so did not continue.    Problems:  Patient Active Problem List   Diagnosis Date Noted   ASTHMA, UNSPECIFIED 07/29/2006    Allergies: No Known Allergies Medications:  Current Outpatient Medications:    pantoprazole (PROTONIX) 40 MG tablet, Take 1 tablet (40 mg total) by mouth daily., Disp: 90 tablet, Rfl: 0  Observations/Objective: Patient is well-developed, well-nourished in no acute distress.  Resting comfortably at home.  Head is normocephalic, atraumatic.  No labored breathing. Speech is clear and coherent with logical content.  Patient is alert and oriented at baseline.    Assessment and Plan: 1. Gastroesophageal reflux disease without esophagitis - pantoprazole (PROTONIX) 40 MG tablet; Take 1 tablet (40 mg total) by mouth daily.  Dispense: 90 tablet; Refill: 0  2. Globus sensation - pantoprazole (PROTONIX) 40 MG tablet; Take 1 tablet (40 mg total) by mouth daily.  Dispense: 90 tablet; Refill: 0  - Failed Omeprazole - Change to Protonix (pantoprazole) - Will have her start once daily x 1 week, can increase to twice daily if needed. If she increases to twice daily, do that for 14 days then decrease back to  once daily - Avoid triggering foods - Limit caffeine and alcohol - Push fluids for tonsillar stones - Advised can use sour candies or lemon if stone gets stuck - Follow up in person if symptoms fail to improve or worsen  Follow Up Instructions: I discussed the assessment and treatment plan with the patient. The patient was provided an opportunity to ask questions and all were answered. The patient agreed with the plan and demonstrated an understanding of the instructions.  A copy of instructions were sent to the patient via MyChart unless otherwise noted below.    The patient was advised to call back or seek an in-person evaluation if the symptoms worsen or if the condition fails to improve as anticipated.    [name] Loveless, PA-C

## 2023-04-21 NOTE — Patient Instructions (Signed)
[name] [name], thank you for joining [name] Loveless, PA-C for today's virtual visit.  While this provider is not your primary care provider (PCP), if your PCP is located in our provider database this encounter information will be shared with them immediately following your visit.   A [facility] MyChart account gives you access to today's visit and all your visits, tests, and labs performed at [facility] " click here if you don't have a [facility] MyChart account or go to [email or web address]  Consent: (Patient) [name] [name] provided verbal consent for this virtual visit at the beginning of the encounter.  Current Medications:  Current Outpatient Medications:    pantoprazole (PROTONIX) 40 MG tablet, Take 1 tablet (40 mg total) by mouth daily., Disp: 90 tablet, Rfl: 0   Medications ordered in this encounter:  Meds ordered this encounter  Medications   pantoprazole (PROTONIX) 40 MG tablet    Sig: Take 1 tablet (40 mg total) by mouth daily.    Dispense:  90 tablet    Refill:  0    Order Specific Question:   Supervising Provider    Answer:   [name] [name] [id number]     *If you need refills on other medications prior to your next appointment, please contact your pharmacy*  Follow-Up: Call back or seek an in-person evaluation if the symptoms worsen or if the condition fails to improve as anticipated.  [facility] [phone]  Other Instructions  Salivary Stone  A salivary stone is a small cluster of mineral (mineral deposit) that builds up in the tubes (ducts) that drain the salivary glands. Most salivary stones are made of calcium. When a stone forms, saliva can back up into the gland and cause painful swelling. Your salivary glands are the glands that make saliva. You have six major salivary glands. Each gland has a duct that carries saliva into your mouth. Saliva keeps your mouth moist and breaks down the food that you eat. It  also helps prevent tooth decay. Two salivary glands are found just in front of your ears (parotid). The ducts for these glands open up inside your cheeks, near your back teeth. You also have two glands under your tongue (sublingual) and two glands under your jaw (submandibular). The ducts for these glands open under your tongue. A stone can form in any salivary gland. The most common place for a salivary stone to form is in a submandibular salivary gland. What are the causes? Salivary stones may be caused by any condition that lessens the flow of saliva. It is not known why some people get stones. What increases the risk? You are more likely to develop this condition if: You do not drink enough water. You smoke. You have any of these: High blood pressure. Gout. Diabetes. What are the signs or symptoms? The main sign of a salivary stone is sudden swelling of a salivary gland during eating. This usually happens under the jaw on one side. Other signs and symptoms may include: Swelling of the cheek or under the tongue during eating. Pain in the swollen area. Trouble chewing or swallowing. Swelling that goes down after eating. Sometimes, the salivary stone may be seen. The stone is oval in shape and may be white or yellow in color. How is this diagnosed? This condition may be diagnosed based on: Your signs and symptoms. A physical exam. In many cases, your health care provider will be able to feel the stone in a duct  inside your mouth. Imaging studies, such as: X-rays. Ultrasound. CT scan. MRI. You may need to see an ear, nose, and throat specialist (ENT or otolaryngologist) for diagnosis and treatment. How is this treated? Treatment for this condition depends on the size of the stone. A small stone that is not causing symptoms may be treated with home care. A stone that is large enough to cause symptoms may be treated by: Probing and widening of the duct to let the stone pass. Putting a  thin, flexible scope (endoscope) into the duct to find and remove the stone. Breaking up the stone with sound waves. Removing the whole salivary gland. Follow these instructions at home: To relieve discomfort Take NSAIDs, such as ibuprofen, to help relieve pain and swelling as told by your health care provider. Follow these instructions every few hours: Suck on a lemon candy or a vitamin C lozenge to prompt the flow of saliva. Put a warm, damp cloth (warmcompress) over the gland. Gently massage the gland. General instructions  Take over-the-counter and prescription medicines only as told by your health care provider. Drink enough fluid to keep your urine pale yellow. Do not use any products that contain nicotine or tobacco. These products include cigarettes, chewing tobacco, and vaping devices, such as e-cigarettes. If you need help quitting, ask your health care provider. Keep all follow-up visits. This is important. Contact a health care provider if: You have pain and swelling in your face, jaw, or mouth after eating. You keep having swelling in any of these places: In front of your ear. Under your jaw. Inside your mouth. Get help right away if: You have pain and swelling in your face, jaw, or mouth, that suddenly gets worse. Your pain and swelling make it hard to swallow, talk, or breathe. These symptoms may be an emergency. Get help right away. Call 911. Do not wait to see if the symptoms will go away. Do not drive yourself to the hospital. Summary A salivary stone is a small clump of mineral (mineral deposit) that builds up in the ducts that drain your salivary glands. When a stone forms, saliva can back up into the gland and cause painful swelling. Salivary stones may be caused by any condition that lessens the flow of saliva. Treatment for this condition depends on the size of the stone. This information is not intended to replace advice given to you by your health care  provider. Make sure you discuss any questions you have with your health care provider. Document Revised: 05/14/2021 Document Reviewed: 05/14/2021 Elsevier Patient Education  2024 Elsevier Inc.   Food Choices for Gastroesophageal Reflux Disease, Adult When you have gastroesophageal reflux disease (GERD), the foods you eat and your eating habits are very important. Choosing the right foods can help ease the discomfort of GERD. Consider working with a dietitian to help you make healthy food choices. What are tips for following this plan? Reading food labels Look for foods that are low in saturated fat. Foods that have less than 5% of daily value (DV) of fat and 0 g of trans fats may help with your symptoms. Cooking Cook foods using methods other than frying. This may include baking, steaming, grilling, or broiling. These are all methods that do not need a lot of fat for cooking. To add flavor, try to use herbs that are low in spice and acidity. Meal planning  Choose healthy foods that are low in fat, such as fruits, vegetables, whole grains, low-fat dairy products, lean  meats, fish, and poultry. Eat frequent, small meals instead of three large meals each day. Eat your meals slowly, in a relaxed setting. Avoid bending over or lying down until 2-3 hours after eating. Limit high-fat foods such as fatty meats or fried foods. Limit your intake of fatty foods, such as oils, butter, and shortening. Avoid the following as told by your health care provider: Foods that cause symptoms. These may be different for different people. Keep a food diary to keep track of foods that cause symptoms. Alcohol. Drinking large amounts of liquid with meals. Eating meals during the 2-3 hours before bed. Lifestyle Maintain a healthy weight. Ask your health care provider what weight is healthy for you. If you need to lose weight, work with your health care provider to do so safely. Exercise for at least 30 minutes on 5  or more days each week, or as told by your health care provider. Avoid wearing clothes that fit tightly around your waist and chest. Do not use any products that contain nicotine or tobacco. These products include cigarettes, chewing tobacco, and vaping devices, such as e-cigarettes. If you need help quitting, ask your health care provider. Sleep with the head of your bed raised. Use a wedge under the mattress or blocks under the bed frame to raise the head of the bed. Chew sugar-free gum after mealtimes. What foods should I eat?  Eat a healthy, well-balanced diet of fruits, vegetables, whole grains, low-fat dairy products, lean meats, fish, and poultry. Each person is different. Foods that may trigger symptoms in one person may not trigger any symptoms in another person. Work with your health care provider to identify foods that are safe for you. The items listed above may not be a complete list of recommended foods and beverages. Contact a dietitian for more information. What foods should I avoid? Limiting some of these foods may help manage the symptoms of GERD. Everyone is different. Consult a dietitian or your health care provider to help you identify the exact foods to avoid, if any. Fruits Any fruits prepared with added fat. Any fruits that cause symptoms. For some people this may include citrus fruits, such as oranges, grapefruit, pineapple, and lemons. Vegetables Deep-fried vegetables. [place] fries. Any vegetables prepared with added fat. Any vegetables that cause symptoms. For some people, this may include tomatoes and tomato products, chili peppers, onions and garlic, and horseradish. Grains Pastries or quick breads with added fat. Meats and other proteins High-fat meats, such as fatty beef or pork, hot dogs, ribs, ham, sausage, salami, and bacon. Fried meat or protein, including fried fish and fried chicken. Nuts and nut butters, in large amounts. Dairy Whole milk and chocolate milk.  Sour cream. Cream. Ice cream. Cream cheese. Milkshakes. Fats and oils Butter. Margarine. Shortening. Ghee. Beverages Coffee and tea, with or without caffeine. Carbonated beverages. Sodas. Energy drinks. Fruit juice made with acidic fruits, such as orange or grapefruit. Tomato juice. Alcoholic drinks. Sweets and desserts Chocolate and cocoa. Donuts. Seasonings and condiments Pepper. Peppermint and spearmint. Added salt. Any condiments, herbs, or seasonings that cause symptoms. For some people, this may include curry, hot sauce, or vinegar-based salad dressings. The items listed above may not be a complete list of foods and beverages to avoid. Contact a dietitian for more information. Questions to ask your health care provider Diet and lifestyle changes are usually the first steps that are taken to manage symptoms of GERD. If diet and lifestyle changes do not improve your symptoms,  talk with your health care provider about taking medicines. Where to find more information International Foundation for Gastrointestinal Disorders: aboutgerd.org Summary When you have gastroesophageal reflux disease (GERD), food and lifestyle choices may be very helpful in easing the discomfort of GERD. Eat frequent, small meals instead of three large meals each day. Eat your meals slowly, in a relaxed setting. Avoid bending over or lying down until 2-3 hours after eating. Limit high-fat foods such as fatty meats or fried foods. This information is not intended to replace advice given to you by your health care provider. Make sure you discuss any questions you have with your health care provider. Document Revised: 11/27/2019 Document Reviewed: 11/27/2019 Elsevier Patient Education  2024 Elsevier Inc.    If you have been instructed to have an in-person evaluation today at a local Urgent Care facility, please use the link below. It will take you to a list of all of our available [facility], including  address, phone number and hours of operation. Please do not delay care.  [facility]  If you or a family member do not have a primary care provider, use the link below to schedule a visit and establish care. When you choose a [facility] primary care physician or advanced practice provider, you gain a long-term partner in health. Find a Primary Care Provider  Learn more about [facility]'s in-office and virtual care options: [facility] - Get Care Now

## 2023-04-21 NOTE — Progress Notes (Signed)
The patient no-showed for appointment despite this provider sending direct link with no response and waiting for at least 10 minutes from appointment time for patient to join. They will be marked as a NS for this appointment/time.   [name] Kemnitz M Kenzie Flakes, PA-C    

## 2023-04-21 NOTE — Progress Notes (Signed)
   Thank you for the details you included in the comment boxes. Those details are very helpful in determining the best course of treatment for you and help [place] to provide the best care. Because of your medical history, we recommend that you schedule a Virtual Urgent Care video visit in order for the provider to better assess what is going on.  The provider will be able to give you a more accurate diagnosis and treatment plan if we can more freely discuss your symptoms and with the addition of a virtual examination.   If you change your visit to a video visit, we will bill your insurance (similar to an office visit) and you will not be charged for this e-Visit. You will be able to stay at home and speak with the first available [facility] advanced practice provider. The link to do a video visit is in the drop down Menu tab of your Welcome screen in MyChart.     I have spent 5 minutes in review of e-visit questionnaire, review and updating patient chart, medical decision making and response to patient.   [name], PA-C

## 2023-06-10 ENCOUNTER — Telehealth: Payer: Medicaid Other | Admitting: Family Medicine

## 2023-06-10 DIAGNOSIS — B9689 Other specified bacterial agents as the cause of diseases classified elsewhere: Secondary | ICD-10-CM

## 2023-06-10 DIAGNOSIS — N760 Acute vaginitis: Secondary | ICD-10-CM | POA: Diagnosis not present

## 2023-06-10 MED ORDER — METRONIDAZOLE 500 MG PO TABS
500.0000 mg | ORAL_TABLET | Freq: Two times a day (BID) | ORAL | 0 refills | Status: AC
Start: 2023-06-10 — End: 2023-06-17

## 2023-06-10 NOTE — Progress Notes (Signed)

## 2023-09-28 ENCOUNTER — Other Ambulatory Visit: Payer: Self-pay | Admitting: Internal Medicine

## 2023-09-28 DIAGNOSIS — Z1231 Encounter for screening mammogram for malignant neoplasm of breast: Secondary | ICD-10-CM

## 2023-10-04 ENCOUNTER — Ambulatory Visit: Admitting: Obstetrics

## 2023-10-06 ENCOUNTER — Ambulatory Visit
Admission: RE | Admit: 2023-10-06 | Discharge: 2023-10-06 | Disposition: A | Discharge status: Home / Self Care | Source: Ambulatory Visit | Attending: Internal Medicine | Admitting: Internal Medicine

## 2023-10-06 DIAGNOSIS — Z1231 Encounter for screening mammogram for malignant neoplasm of breast: Secondary | ICD-10-CM

## 2023-10-08 ENCOUNTER — Emergency Department (HOSPITAL_BASED_OUTPATIENT_CLINIC_OR_DEPARTMENT_OTHER)

## 2023-10-08 ENCOUNTER — Encounter (HOSPITAL_BASED_OUTPATIENT_CLINIC_OR_DEPARTMENT_OTHER): Payer: Self-pay | Admitting: Emergency Medicine

## 2023-10-08 ENCOUNTER — Other Ambulatory Visit: Payer: Self-pay

## 2023-10-08 ENCOUNTER — Emergency Department (HOSPITAL_BASED_OUTPATIENT_CLINIC_OR_DEPARTMENT_OTHER)
Admission: EM | Admit: 2023-10-08 | Discharge: 2023-10-08 | Disposition: A | Discharge status: Home / Self Care | Attending: Emergency Medicine | Admitting: Emergency Medicine

## 2023-10-08 DIAGNOSIS — R1011 Right upper quadrant pain: Secondary | ICD-10-CM | POA: Diagnosis present

## 2023-10-08 DIAGNOSIS — K219 Gastro-esophageal reflux disease without esophagitis: Secondary | ICD-10-CM

## 2023-10-08 DIAGNOSIS — R09A2 Foreign body sensation, throat: Secondary | ICD-10-CM

## 2023-10-08 DIAGNOSIS — R1010 Upper abdominal pain, unspecified: Secondary | ICD-10-CM

## 2023-10-08 LAB — URINALYSIS, ROUTINE W REFLEX MICROSCOPIC
Bilirubin Urine: NEGATIVE
Glucose, UA: NEGATIVE mg/dL
Hgb urine dipstick: NEGATIVE
Ketones, ur: NEGATIVE mg/dL
Nitrite: NEGATIVE
Protein, ur: NEGATIVE mg/dL
Specific Gravity, Urine: 1.015 (ref 1.005–1.030)
pH: 6 (ref 5.0–8.0)

## 2023-10-08 LAB — COMPREHENSIVE METABOLIC PANEL WITH GFR
ALT: 9 U/L (ref 0–44)
AST: 16 U/L (ref 15–41)
Albumin: 3.9 g/dL (ref 3.5–5.0)
Alkaline Phosphatase: 53 U/L (ref 38–126)
Anion gap: 11 (ref 5–15)
BUN: 21 mg/dL — ABNORMAL HIGH (ref 6–20)
CO2: 22 mmol/L (ref 22–32)
Calcium: 9.3 mg/dL (ref 8.9–10.3)
Chloride: 107 mmol/L (ref 98–111)
Creatinine, Ser: 1.53 mg/dL — ABNORMAL HIGH (ref 0.44–1.00)
GFR, Estimated: 43 mL/min — ABNORMAL LOW (ref >60)
Glucose, Bld: 101 mg/dL — ABNORMAL HIGH (ref 70–99)
Potassium: 4.1 mmol/L (ref 3.5–5.1)
Sodium: 140 mmol/L (ref 135–145)
Total Bilirubin: <0.2 mg/dL (ref 0.0–1.2)
Total Protein: 6.6 g/dL (ref 6.5–8.1)

## 2023-10-08 LAB — URINALYSIS, MICROSCOPIC (REFLEX): RBC / HPF: NONE SEEN RBC/hpf (ref 0–5)

## 2023-10-08 LAB — CBC WITH DIFFERENTIAL/PLATELET
Abs Immature Granulocytes: 0.03 10*3/uL (ref 0.00–0.07)
Basophils Absolute: 0 10*3/uL (ref 0.0–0.1)
Basophils Relative: 0 %
Eosinophils Absolute: 0.3 10*3/uL (ref 0.0–0.5)
Eosinophils Relative: 4 %
HCT: 37.7 % (ref 36.0–46.0)
Hemoglobin: 12.6 g/dL (ref 12.0–15.0)
Immature Granulocytes: 0 %
Lymphocytes Relative: 29 %
Lymphs Abs: 2.2 10*3/uL (ref 0.7–4.0)
MCH: 31 pg (ref 26.0–34.0)
MCHC: 33.4 g/dL (ref 30.0–36.0)
MCV: 92.9 fL (ref 80.0–100.0)
Monocytes Absolute: 0.5 10*3/uL (ref 0.1–1.0)
Monocytes Relative: 7 %
Neutro Abs: 4.4 10*3/uL (ref 1.7–7.7)
Neutrophils Relative %: 60 %
Platelets: 243 10*3/uL (ref 150–400)
RBC: 4.06 MIL/uL (ref 3.87–5.11)
RDW: 12.9 % (ref 11.5–15.5)
WBC: 7.4 10*3/uL (ref 4.0–10.5)
nRBC: 0 % (ref 0.0–0.2)

## 2023-10-08 LAB — LIPASE, BLOOD: Lipase: 30 U/L (ref 11–51)

## 2023-10-08 MED ORDER — PANTOPRAZOLE SODIUM 40 MG PO TBEC
40.0000 mg | DELAYED_RELEASE_TABLET | Freq: Every day | ORAL | 0 refills | Status: AC
Start: 2023-10-08 — End: 2023-12-14

## 2023-10-08 MED ORDER — PANTOPRAZOLE SODIUM 40 MG IV SOLR
40.0000 mg | Freq: Once | INTRAVENOUS | Status: AC
  Administered 2023-10-08: 40 mg via INTRAVENOUS
  Filled 2023-10-08: qty 10

## 2023-10-08 MED ORDER — LACTATED RINGERS IV BOLUS
1000.0000 mL | Freq: Once | INTRAVENOUS | Status: AC
  Administered 2023-10-08: 1000 mL via INTRAVENOUS

## 2023-10-08 NOTE — ED Triage Notes (Signed)
 Pt presents with bilateral upper quadrant pain x 5 days.  States it feels like "gas pains"  Has been having normal bowel movements, has taken gas-x, and has had no relief.

## 2023-10-08 NOTE — ED Provider Notes (Signed)
 [name] [name] EMERGENCY DEPARTMENT AT [facility]  Provider Note  CSN: [id number] Arrival date & time: 10/08/23 0115  History Chief Complaint  Patient presents with   Abdominal Pain    [name] [name] is a 43 y.o. female with history of GERD and prior hysterectomy reports 5 days of epigastric and RUQ abdominal pain. No N/V/D, dysuria. She does not notice any difference with eating. Has been taking gas-x and laxatives without improvement. Last BM was yesterday.    Home Medications Prior to Admission medications   Medication Sig Start Date End Date Taking? Authorizing Provider  pantoprazole  (PROTONIX ) 40 MG tablet Take 1 tablet (40 mg total) by mouth daily. 10/08/23 11/07/23  Charmayne Cooper, MD  cetirizine (ZYRTEC) 10 MG tablet TK 1 T PO QPM PRF ALLERGIES Patient not taking: Reported on 01/01/2020 01/26/19 10/17/20  [provider]     Allergies    Patient has no known allergies.   Review of Systems   Review of Systems Please see HPI for pertinent positives and negatives  Physical Exam BP (!) 120/107 (BP Location: Right Arm)   Pulse 81   Temp 98.2 F (36.8 C) (Oral)   Resp 18   LMP 07/30/2013   SpO2 100%   Physical Exam Vitals and nursing note reviewed.  Constitutional:      Appearance: Normal appearance.  HENT:     Head: Normocephalic and atraumatic.     Nose: Nose normal.     Mouth/Throat:     Mouth: Mucous membranes are moist.  Eyes:     Extraocular Movements: Extraocular movements intact.     Conjunctiva/sclera: Conjunctivae normal.  Cardiovascular:     Rate and Rhythm: Normal rate.  Pulmonary:     Effort: Pulmonary effort is normal.     Breath sounds: Normal breath sounds.  Abdominal:     General: Abdomen is flat.     Palpations: Abdomen is soft.     Tenderness: There is abdominal tenderness in the right upper quadrant. There is no guarding. Negative signs include Murphy's sign and McBurney's sign.  Musculoskeletal:        General: No  swelling. Normal range of motion.     Cervical back: Neck supple.  Skin:    General: Skin is warm and dry.  Neurological:     General: No focal deficit present.     Mental Status: She is alert.  Psychiatric:        Mood and Affect: Mood normal.     ED Results / Procedures / Treatments   EKG None  Procedures Procedures  Medications Ordered in the ED Medications  lactated ringers  bolus 1,000 mL (1,000 mLs Intravenous New Bag/Given 10/08/23 0224)  pantoprazole  (PROTONIX ) injection 40 mg (40 mg Intravenous Given 10/08/23 0217)    Initial Impression and Plan  Patient here with epigastric and RUQ pain, some tenderness but no guarding. Consider hepatobiliary disease, pancreatitis, GERD, PUD, etc. Will check labs, send for US , consider CT if neg.   ED Course   Clinical Course as of 10/08/23 0258  Fri Oct 08, 2023  0147 CBC is normal.  [CS]  0209 CMP with mildly elevated Cr, not significantly changed from previous.  [CS]  0210 Lipase is normal.  [CS]  0224 I personally viewed the images from radiology studies and agree with radiologist interpretation: US  is neg. Will give a dose of protonix  and IVF bolus and reassess.  [CS]  0239 UA is clear.  [CS]  0254 Abdomen  remains benign. With negative US  and unremarkable labs do not feel that CT would be beneficial at this point. Plan discharge with Rx for protonix , GI referral, PCP follow up, RTED for any other concerns.   [CS]    Clinical Course User Index [CS] Charmayne Cooper, MD     MDM Rules/Calculators/A&P Medical Decision Making Given presenting complaint, I considered that admission might be necessary. After review of results from ED lab and/or imaging studies, admission to the hospital is not indicated at this time.    Problems Addressed: Upper abdominal pain: acute illness or injury  Amount and/or Complexity of Data Reviewed Labs: ordered. Decision-making details documented in ED Course. Radiology: ordered and independent  interpretation performed. Decision-making details documented in ED Course.  Risk Prescription drug management. Decision regarding hospitalization.     Final Clinical Impression(s) / ED Diagnoses Final diagnoses:  Upper abdominal pain    Rx / DC Orders ED Discharge Orders          Ordered    pantoprazole  (PROTONIX ) 40 MG tablet  Daily        10/08/23 0257             Charmayne Cooper, MD 10/08/23 [phone]

## 2023-10-08 NOTE — ED Notes (Signed)
 Ultrasound at bedside

## 2023-12-10 ENCOUNTER — Emergency Department (HOSPITAL_BASED_OUTPATIENT_CLINIC_OR_DEPARTMENT_OTHER)

## 2023-12-10 ENCOUNTER — Encounter (HOSPITAL_BASED_OUTPATIENT_CLINIC_OR_DEPARTMENT_OTHER): Payer: Self-pay

## 2023-12-10 ENCOUNTER — Other Ambulatory Visit: Payer: Self-pay

## 2023-12-10 ENCOUNTER — Emergency Department (HOSPITAL_BASED_OUTPATIENT_CLINIC_OR_DEPARTMENT_OTHER)
Admission: EM | Admit: 2023-12-10 | Discharge: 2023-12-10 | Disposition: A | Discharge status: Home / Self Care | Attending: Emergency Medicine | Admitting: Emergency Medicine

## 2023-12-10 DIAGNOSIS — J45909 Unspecified asthma, uncomplicated: Secondary | ICD-10-CM | POA: Insufficient documentation

## 2023-12-10 DIAGNOSIS — R002 Palpitations: Secondary | ICD-10-CM | POA: Insufficient documentation

## 2023-12-10 DIAGNOSIS — R072 Precordial pain: Secondary | ICD-10-CM | POA: Insufficient documentation

## 2023-12-10 DIAGNOSIS — R0981 Nasal congestion: Secondary | ICD-10-CM | POA: Diagnosis not present

## 2023-12-10 DIAGNOSIS — R202 Paresthesia of skin: Secondary | ICD-10-CM | POA: Insufficient documentation

## 2023-12-10 DIAGNOSIS — R079 Chest pain, unspecified: Secondary | ICD-10-CM | POA: Diagnosis present

## 2023-12-10 LAB — CBC
HCT: 37.7 % (ref 36.0–46.0)
Hemoglobin: 12.9 g/dL (ref 12.0–15.0)
MCH: 31 pg (ref 26.0–34.0)
MCHC: 34.2 g/dL (ref 30.0–36.0)
MCV: 90.6 fL (ref 80.0–100.0)
Platelets: 263 K/uL (ref 150–400)
RBC: 4.16 MIL/uL (ref 3.87–5.11)
RDW: 13 % (ref 11.5–15.5)
WBC: 7.3 K/uL (ref 4.0–10.5)
nRBC: 0 % (ref 0.0–0.2)

## 2023-12-10 LAB — BASIC METABOLIC PANEL WITH GFR
Anion gap: 13 (ref 5–15)
BUN: 12 mg/dL (ref 6–20)
CO2: 23 mmol/L (ref 22–32)
Calcium: 9.6 mg/dL (ref 8.9–10.3)
Chloride: 103 mmol/L (ref 98–111)
Creatinine, Ser: 1.18 mg/dL — ABNORMAL HIGH (ref 0.44–1.00)
GFR, Estimated: 59 mL/min — ABNORMAL LOW (ref >60)
Glucose, Bld: 100 mg/dL — ABNORMAL HIGH (ref 70–99)
Potassium: 3.5 mmol/L (ref 3.5–5.1)
Sodium: 139 mmol/L (ref 135–145)

## 2023-12-10 LAB — TROPONIN T, HIGH SENSITIVITY: Troponin T High Sensitivity: <15 ng/L (ref <19)

## 2023-12-10 MED ORDER — FLUTICASONE PROPIONATE 50 MCG/ACT NA SUSP: 2.0000 | Freq: Every day | NASAL | 0 refills | Status: DC

## 2023-12-10 MED ORDER — KETOROLAC TROMETHAMINE 30 MG/ML IJ SOLN
30.0000 mg | Freq: Once | INTRAMUSCULAR | Status: AC
  Administered 2023-12-10: 30 mg via INTRAMUSCULAR
  Filled 2023-12-10: qty 1

## 2023-12-10 NOTE — Discharge Instructions (Signed)

## 2023-12-10 NOTE — ED Triage Notes (Signed)
 Pt states she feels shaky, has left sided chest pain described as tightness & left facial numbness starting 2 hours ago. Pt reports some nausea, denies SOB.  States when the episode started around 2 hours ago her ears got full & she felt like she did when she has fainted before.

## 2023-12-11 NOTE — ED Provider Notes (Signed)
 Emergency Department Provider Note   I have reviewed the triage vital signs and the nursing notes.   HISTORY  Chief Complaint Chest Pain   HPI [name] [name] is a 43 y.o. female with past history reviewed presents to the emergency department with some chest discomfort with full feeling to the sinuses and left ear.  She states the left side of her face felt a little bit funny and full but denies any numbness to me. Reports some occasional tingling in the fingers. She notes a history of anxiety and is between medications currently. No CP currently.    Past Medical History:  Diagnosis Date   Asthma    childhood - no problems as adult, no inhaler   History of blood transfusion 2005   WH with c/s surgery   Seasonal allergies     Review of Systems  Constitutional: No fever/chills ENT: No sore throat. Positive sinus pressure.  Cardiovascular: Positive chest pain. Respiratory: Denies shortness of breath. Gastrointestinal: No abdominal pain.  Skin: Negative for rash. Neurological: Negative for headaches.  ____________________________________________   PHYSICAL EXAM:  VITAL SIGNS: ED Triage Vitals  Encounter Vitals Group     BP 12/10/23 2100 (!) 156/103     Pulse Rate 12/10/23 2100 98     Resp 12/10/23 2100 14     Temp 12/10/23 2100 98 F (36.7 C)     Temp Source 12/10/23 2100 Oral     SpO2 12/10/23 2100 100 %     Weight 12/10/23 2059 148 lb (67.1 kg)     Height 12/10/23 2059 5' 5 (1.651 m)   Constitutional: Alert and oriented. Well appearing and in no acute distress. Eyes: Conjunctivae are normal.  Head: Atraumatic. Ears: Normal external canals.  TMs normal bilaterally.  No mastoid tenderness. Nose: No congestion/rhinnorhea. Mouth/Throat: Mucous membranes are moist.  Neck: No stridor.   Cardiovascular: Normal rate, regular rhythm. Good peripheral circulation. Grossly normal heart sounds.   Respiratory: Normal respiratory effort.  No retractions. Lungs  CTAB. Gastrointestinal: Soft and nontender. No distention.  Musculoskeletal: No lower extremity tenderness nor edema. No gross deformities of extremities. Neurologic:  Normal speech and language. No gross focal neurologic deficits are appreciated.  Skin:  Skin is warm, dry and intact. No rash noted.   ____________________________________________   LABS (all labs ordered are listed, but only abnormal results are displayed)  Labs Reviewed  BASIC METABOLIC PANEL WITH GFR - Abnormal; Notable for the following components:      Result Value   Glucose, Bld 100 (*)    Creatinine, Ser 1.18 (*)    GFR, Estimated 59 (*)    All other components within normal limits  CBC  TROPONIN T, HIGH SENSITIVITY   ____________________________________________  EKG   EKG Interpretation Date/Time:  Friday December 10 2023 21:00:11 EDT Ventricular Rate:  79 PR Interval:  147 QRS Duration:  77 QT Interval:  359 QTC Calculation: 412 R Axis:   30  Text Interpretation: Sinus rhythm Confirmed by Darra Chew [phone]) on 12/10/2023 11:12:17 PM        ____________________________________________  RADIOLOGY  DG Chest 2 View Result Date: 12/10/2023 CLINICAL DATA:  Chest pain and fatigue for 2 hours, initial encounter EXAM: CHEST - 2 VIEW COMPARISON:  08/02/2013 FINDINGS: The heart size and mediastinal contours are within normal limits. Both lungs are clear. The visualized skeletal structures are unremarkable. IMPRESSION: No active cardiopulmonary disease. Electronically Signed   By: Oneil Devonshire M.D.   On: 12/10/2023 21:52  ____________________________________________   PROCEDURES  Procedure(s) performed:   Procedures  None  ____________________________________________   INITIAL IMPRESSION / ASSESSMENT AND PLAN / ED COURSE  Pertinent labs & imaging results that were available during my care of the patient were reviewed by me and considered in my medical decision making (see chart for  details).   This patient is Presenting for Evaluation of CP, which does require a range of treatment options, and is a complaint that involves a high risk of morbidity and mortality.  The Differential Diagnoses includes but is not exclusive to acute coronary syndrome, aortic dissection, pulmonary embolism, cardiac tamponade, community-acquired pneumonia, pericarditis, musculoskeletal chest wall pain, etc.   Critical Interventions-    Medications  ketorolac  (TORADOL ) 30 MG/ML injection 30 mg (30 mg Intramuscular Given 12/10/23 2331)    Reassessment after intervention: pain symptoms improved.   Clinical Laboratory Tests Ordered, included troponin normal.  Creatinine minimally elevated at 1.18.  CBC without leukocytosis.  Radiologic Tests Ordered, included CXR. I independently interpreted the images and agree with radiology interpretation.   Cardiac Monitor Tracing which shows NSR.    Social Determinants of Health Risk patient is a non-smoker.   Medical Decision Making: Summary:  Patient presents the emergency department with some chest discomfort and full feeling to the left face.  Discussed management of sinusitis.  She is only had several days of discomfort and so is not a candidate for antibiotics.  No fevers.  Patient self reports some feeling of anxiety and wondering if that could be contributing.  We had a detailed discussion about management of anxiety and need for close PCP follow-up.  She states her anxiety medicine was recently changed.   Reevaluation with update and discussion with patient.  She is feeling improved after Toradol .  Flonase  called into the pharmacy.  Plan for close PCP follow-up and strict ED return precautions.  Patient's presentation is most consistent with acute presentation with potential threat to life or bodily function.   Disposition: discharge  ____________________________________________  FINAL CLINICAL IMPRESSION(S) / ED DIAGNOSES  Final  diagnoses:  Precordial chest pain  Palpitations  Sinus congestion     Note:  This document was prepared using Dragon voice recognition software and may include unintentional dictation errors.  Fonda Law, MD, [facility]    Bran Aldridge, Fonda MATSU, MD 12/11/23 [id number]

## 2023-12-13 NOTE — Progress Notes (Unsigned)
 Cardiology Office Note    Date:  12/14/2023  ID:  [name] [name], DOB 01-10-1981, MRN [id number] PCP:  [name] Atlas, MD  Cardiologist:  None - New  Chief Complaint: chest pain  History of Present Illness: .    [name] [name] is a 43 y.o. female with visit-pertinent history of asthma in childhood, GERD, anxiety, CKD 3a by labs seen for evaluation of chest pain at the request of [name] [name].  She has been having chest pain on and off for about 2 weeks without particular pattern. The day of the ED visit it had been going on for approximately 1-4 hours. It is described as a general discomfort/pressure like someone sitting on her chest. She also had noted various symptoms including headache, pressure in her head, occasional tingling around her face, tingling in her fingers, and had also been dealing with increased anxiety recently. No specific intervention provokes the chest pain or makes it better; she has continued to feel it intermittently ever since ED visit. There is mention of palpitations though the patient does not specifically describe this. She report it feels more like when you are under water  in the pool then come up for air.  Family History: mother died in 2023/11/15 after being diagnosed with CHF 2 months prior, also had CAD but was told poor candidate for cath due to schizophrenia ([name] [name] was her cardiologist), father had defibrillator for unclear reasons, also had h/o seizures and alcoholism Tobacco: none Alcohol: 1-2 drinks 2-3 nights a week Drug use: none  Labwork independently reviewed: 11/2023 troponin neg, CBC wnl, K 3.5, CR 1.18 12/2022 Mg wnl  ROS: .    Please see the history of present illness.  All other systems are reviewed and otherwise negative.  Studies Reviewed: [name] [name]    EKG:  EKG is ordered today, personally reviewed, demonstrating:  EKG Interpretation Date/Time:  Tuesday December 14 2023 15:02:17 EDT Ventricular Rate:  71 PR Interval:  150 QRS  Duration:  82 QT Interval:  368 QTC Calculation: 399 R Axis:   57  Text Interpretation: Normal sinus rhythm Nonspecific ST upsloping seen in II, III, avF, V3-V6 - similar to 2024 Confirmed by [name] [name] [phone]) on 12/14/2023 3:17:21 PM    CV Studies: Cardiac studies reviewed are outlined and summarized above. Otherwise please see EMR for full report.   Current Reported Medications:.    Current Meds  Medication Sig   escitalopram (LEXAPRO) 20 MG tablet Take 20 mg by mouth every evening.   hydrOXYzine (ATARAX) 25 MG tablet Take 25 mgby mouth daily as needed for anxiety.   pantoprazole  (PROTONIX ) 40 MG tablet Take 1 tablet (40 mg total) by mouth daily.   Vitamin D , Ergocalciferol , (DRISDOL) 1.25 MG (50000 UNIT) CAPS capsule Take 50,000 Units by mouth once a week.   cetirizine (ZYRTEC) 10 MG tablet TK 1 T PO QPM PRF ALLERGIES    Physical Exam:    VS:  BP 110/86   Pulse 71   Ht 5' 5 (1.651 m)   Wt 155 lb 6.4 oz (70.5 kg)   LMP 07/30/2013   SpO2 99%   BMI 25.86 kg/m    Wt Readings from Last 3 Encounters:  12/14/23 155 lb 6.4 oz (70.5 kg)  12/10/23 148 lb (67.1 kg)  01/09/23 160 lb (72.6 kg)    GEN: Well nourished, well developed in no acute distress NECK: No JVD; No carotid bruits CARDIAC: RRR, no murmurs, rubs, gallops RESPIRATORY:  Clear to auscultation without rales,  wheezing or rhonchi  ABDOMEN: Soft, non-tender, non-distended EXTREMITIES:  No edema; No acute deformity   Asessement and Plan:.    1. Chest discomfort of uncertain etiology, nonspecific abnormal EKG - nonspecific, diffuse symptoms, persistent with mixed features and family history of heart disease. EKG is abnormal but appears similar back to 2013. There are no pleuritic symptoms. She is not tachycardic, tachypneic or hypoxic. No SOB. With CKD, will see if we can avoid coronary CTA. Will plan exercise nuclear stress test. If she is unable to reach THR, OK to switch to Lexiscan (consented for both). Will also  obtain echocardiogram. We also discussed that anxiety can be contributing to symptoms, but the testing will help us  evaluate a plan going forward for persistent symptoms.  2. Palpitations - mentioned in recent ER note though description somewhat atypical for palpitations. More described as a sensation of coming up out of a pool for air. Will proceed with testing above. Check TSH. Maintaining NSR on exam even when she felt some discomfort.  3. Family history of cardiomyopathy - mother died within 2 months of diagnosis of CHF, father had defibrillator. Will obtain echocardiogram.  Disposition: F/u with me in 2-3 months (after testing). Eventual establishment with Dr. Shlomo [facility] [name]) if further cardiac care needed.  Signed, [name] [name] N [name] Steinkamp, PA-C

## 2023-12-14 ENCOUNTER — Encounter: Payer: Self-pay | Admitting: Physician Assistant

## 2023-12-14 ENCOUNTER — Ambulatory Visit: Attending: Physician Assistant | Admitting: Physician Assistant

## 2023-12-14 VITALS — BP 110/86 | HR 71 | Ht 65.0 in | Wt 155.4 lb

## 2023-12-14 DIAGNOSIS — R9431 Abnormal electrocardiogram [ECG] [EKG]: Secondary | ICD-10-CM | POA: Insufficient documentation

## 2023-12-14 DIAGNOSIS — R002 Palpitations: Secondary | ICD-10-CM | POA: Insufficient documentation

## 2023-12-14 DIAGNOSIS — R079 Chest pain, unspecified: Secondary | ICD-10-CM | POA: Insufficient documentation

## 2023-12-14 DIAGNOSIS — Z8249 Family history of ischemic heart disease and other diseases of the circulatory system: Secondary | ICD-10-CM | POA: Insufficient documentation

## 2023-12-14 NOTE — Patient Instructions (Signed)
 Medication Instructions:  No changes *If you need a refill on your cardiac medications before your next appointment, please call your pharmacy*  Lab Work: Today we are going to draw a TSH If you have labs (blood work) drawn today and your tests are completely normal, you will receive your results only by: MyChart Message (if you have MyChart) OR A paper copy in the mail If you have any lab test that is abnormal or we need to change your treatment, we will call you to review the results.  Testing/Procedures: Your physician has requested that you have an echocardiogram. Echocardiography is a painless test that uses sound waves to create images of your heart. It provides your doctor with information about the size and shape of your heart and how well your heart's chambers and valves are working. This procedure takes approximately one hour. There are no restrictions for this procedure. Please do NOT wear cologne, perfume, aftershave, or lotions (deodorant is allowed). Please arrive 15 minutes prior to your appointment time.  Please note: We ask at that you not bring children with you during ultrasound (echo/ vascular) testing. Due to room size and safety concerns, children are not allowed in the ultrasound rooms during exams. Our front office staff cannot provide observation of children in our lobby area while testing is being conducted. An adult accompanying a patient to their appointment will only be allowed in the ultrasound room at the discretion of the ultrasound technician under special circumstances. We apologize for any inconvenience.  Your physician has requested that you have en exercise stress myoview. For further information please visit [email or web address] Please follow instruction sheet, as given.  Follow-Up: At [facility], you and your health needs are our priority.  As part of our continuing mission to provide you with exceptional heart care, our providers are all part of  one team.  This team includes your primary Cardiologist (physician) and Advanced Practice Providers or APPs (Physician Assistants and Nurse Practitioners) who all work together to provide you with the care you need, when you need it.  Your next appointment:   2-3 month(s)  Provider:   Raphael Bring, PA-C   We recommend signing up for the patient portal called MyChart.  Sign up information is provided on this After Visit Summary.  MyChart is used to connect with patients for Virtual Visits (Telemedicine).  Patients are able to view lab/test results, encounter notes, upcoming appointments, etc.  Non-urgent messages can be sent to your provider as well.   To learn more about what you can do with MyChart, go to [email or web address].

## 2023-12-15 ENCOUNTER — Ambulatory Visit: Payer: Self-pay | Admitting: Physician Assistant

## 2023-12-15 LAB — TSH: TSH: 0.68 u[IU]/mL (ref 0.450–4.500)

## 2023-12-28 ENCOUNTER — Encounter (HOSPITAL_COMMUNITY)

## 2024-01-12 ENCOUNTER — Telehealth (HOSPITAL_COMMUNITY): Payer: Self-pay | Admitting: *Deleted

## 2024-01-12 ENCOUNTER — Encounter (HOSPITAL_COMMUNITY): Payer: Self-pay | Admitting: *Deleted

## 2024-01-12 NOTE — Telephone Encounter (Signed)
 STRESS TEST instructions were sent via mail [employer].

## 2024-01-20 ENCOUNTER — Ambulatory Visit (HOSPITAL_COMMUNITY)
Admission: RE | Admit: 2024-01-20 | Discharge: 2024-01-20 | Disposition: A | Discharge status: Home / Self Care | Source: Ambulatory Visit | Attending: Cardiology | Admitting: Cardiology

## 2024-01-20 ENCOUNTER — Other Ambulatory Visit: Payer: Self-pay | Admitting: Physician Assistant

## 2024-01-20 DIAGNOSIS — R002 Palpitations: Secondary | ICD-10-CM | POA: Diagnosis not present

## 2024-01-20 DIAGNOSIS — R079 Chest pain, unspecified: Secondary | ICD-10-CM

## 2024-01-20 LAB — ECHOCARDIOGRAM COMPLETE: S' Lateral: 2.78 cm

## 2024-01-21 ENCOUNTER — Ambulatory Visit (HOSPITAL_BASED_OUTPATIENT_CLINIC_OR_DEPARTMENT_OTHER)
Admission: RE | Admit: 2024-01-21 | Discharge: 2024-01-21 | Disposition: A | Discharge status: Home / Self Care | Source: Ambulatory Visit | Attending: Physician Assistant | Admitting: Physician Assistant

## 2024-01-21 DIAGNOSIS — R079 Chest pain, unspecified: Secondary | ICD-10-CM | POA: Diagnosis not present

## 2024-01-21 DIAGNOSIS — R002 Palpitations: Secondary | ICD-10-CM | POA: Diagnosis not present

## 2024-01-21 LAB — MYOCARDIAL PERFUSION IMAGING
Angina Index: 0
Duke Treadmill Score: 10
Estimated workload: 12.1
Exercise duration (min): 10 min
Exercise duration (sec): 15 s
LV dias vol: 76 mL (ref 46–106)
LV sys vol: 19 mL (ref 3.8–5.2)
MPHR: 179 {beats}/min
Nuc Stress EF: 75 %
Peak HR: 169 {beats}/min
Percent HR: 94 %
Rest HR: 92 {beats}/min
Rest Nuclear Isotope Dose: 10.2 mCi
SDS: 0
SRS: 0
SSS: 0
ST Depression (mm): 0 mm
Stress Nuclear Isotope Dose: 31.9 mCi
TID: 0.98

## 2024-01-21 MED ORDER — TECHNETIUM TC 99M TETROFOSMIN IV KIT
10.2000 | PACK | Freq: Once | INTRAVENOUS | Status: AC | PRN
  Administered 2024-01-21: 10.2 via INTRAVENOUS

## 2024-01-21 MED ORDER — TECHNETIUM TC 99M TETROFOSMIN IV KIT
31.9000 | PACK | Freq: Once | INTRAVENOUS | Status: AC | PRN
  Administered 2024-01-21: 31.9 via INTRAVENOUS

## 2024-01-24 ENCOUNTER — Ambulatory Visit: Payer: Self-pay | Admitting: Physician Assistant

## 2024-01-24 DIAGNOSIS — R079 Chest pain, unspecified: Secondary | ICD-10-CM

## 2024-01-24 DIAGNOSIS — R9431 Abnormal electrocardiogram [ECG] [EKG]: Secondary | ICD-10-CM

## 2024-01-24 DIAGNOSIS — Z8249 Family history of ischemic heart disease and other diseases of the circulatory system: Secondary | ICD-10-CM

## 2024-02-11 ENCOUNTER — Other Ambulatory Visit (HOSPITAL_COMMUNITY)
Admission: RE | Admit: 2024-02-11 | Discharge: 2024-02-11 | Disposition: A | Discharge status: Home / Self Care | Source: Ambulatory Visit | Attending: Obstetrics and Gynecology | Admitting: Obstetrics and Gynecology

## 2024-02-11 ENCOUNTER — Ambulatory Visit: Admitting: Obstetrics and Gynecology

## 2024-02-11 VITALS — BP 131/87 | HR 85 | Ht 64.0 in | Wt 147.0 lb

## 2024-02-11 DIAGNOSIS — N760 Acute vaginitis: Secondary | ICD-10-CM

## 2024-02-11 DIAGNOSIS — N920 Excessive and frequent menstruation with regular cycle: Secondary | ICD-10-CM | POA: Insufficient documentation

## 2024-02-11 NOTE — Progress Notes (Signed)
 Pt states she woke up yesterday and noticed some brownish spotting.  Pt states no recent intercourse.  Pt has had some issues with bladder, has appt next week with urology.

## 2024-02-11 NOTE — Progress Notes (Signed)
 GYNECOLOGY VISIT  Patient name: [name] [name] MRN [id number]  Date of birth: 10-17-1980 Chief Complaint:   Gynecologic Exam   History:  Discussed the use of AI scribe software for clinical note transcription with the patient, who gave verbal consent to proceed.  History of Present Illness [name] [name] is a 43 year old female who presents with vaginal spotting post-hysterectomy.  She experienced brown vaginal spotting that began yesterday, with no further episodes today. No associated bright red bleeding, itching, or abnormal discharge. This is the first occurrence of spotting since her hysterectomy.  She has been experiencing bladder issues since her hysterectomy, including bladder contractions that occur if she holds her urine or during certain activities, causing significant discomfort and waking her from sleep. These contractions can last for two days. She has been evaluated by a urologist and had a CT scan scheduled due to incomplete bladder emptying noted on an ultrasound. She was given medication samples by the urologist to try, but she is unsure of the names of these medications.  No recent sexual activity within the last four days. Reports a recent unintentional weight loss of twelve pounds, which she attributes to stress. No vaginal dryness.   The following portions of the patient's history were reviewed and updated as appropriate: allergies, current medications, past family history, past medical history, past social history, past surgical history and problem list.   Health Maintenance:   Last pap No results found for: DIAGPAP, HPVHIGH, ADEQPAP  Health Maintenance  Topic Date Due   Pneumococcal Vaccine (1 of 2 - PCV) Never done   Hepatitis B Vaccine (1 of 3 - 19+ 3-dose series) Never done   HPV Vaccine (1 - 3-dose SCDM series) Never done   DTaP/Tdap/Td vaccine (2 - Td or Tdap) 01/08/2023   Flu Shot  Never done   COVID-19 Vaccine (3 - 2025-26 season)  01/31/2024   Breast Cancer Screening  10/05/2025   Hepatitis C Screening  Completed   HIV Screening  Completed   Meningitis B Vaccine  Aged Out      Review of Systems:  Pertinent items are noted in HPI. Comprehensive review of systems was otherwise negative.   Objective:  Physical Exam BP 131/87   Pulse 85   Ht 5' 4 (1.626 m)   Wt 147 lb (66.7 kg)   LMP 07/30/2013   BMI 25.23 kg/m    Physical Exam Vitals and nursing note reviewed. Exam conducted with a chaperone present.  Constitutional:      Appearance: Normal appearance.  HENT:     Head: Normocephalic and atraumatic.  Pulmonary:     Effort: Pulmonary effort is normal.     Breath sounds: Normal breath sounds.  Genitourinary:    General: Normal vulva.     Exam position: Lithotomy position.     Vagina: Normal.     Comments: Scattered melanocytic changes to the mucosa including the urethra, otherwise normal appearing vaginal canal and cuff  Surgically absent cervix and uterus Skin:    General: Skin is warm and dry.  Neurological:     General: No focal deficit present.     Mental Status: She is alert.  Psychiatric:        Mood and Affect: Mood normal.        Behavior: Behavior normal.        Thought Content: Thought content normal.        Judgment: Judgment normal.        Assessment &  Plan:   Assessment & Plan Post-hysterectomy vaginal spotting Recent brown spotting with no further episodes. No visible source of bleeding on examination. Possible medication reaction considered. - Speculum exam within normal limits - Obtain swab for analysis to rule out infection or other causes. - Await swab results and follow up on abnormalities.    Carter Quarry, MD [facility], [facility]

## 2024-02-14 LAB — CERVICOVAGINAL ANCILLARY ONLY
Bacterial Vaginitis (gardnerella): POSITIVE — AB
Candida Glabrata: NEGATIVE
Candida Vaginitis: POSITIVE — AB
Comment: NEGATIVE
Comment: NEGATIVE
Comment: NEGATIVE

## 2024-02-15 ENCOUNTER — Ambulatory Visit: Payer: Self-pay | Admitting: Obstetrics and Gynecology

## 2024-02-15 DIAGNOSIS — B3731 Acute candidiasis of vulva and vagina: Secondary | ICD-10-CM

## 2024-02-15 DIAGNOSIS — B9689 Other specified bacterial agents as the cause of diseases classified elsewhere: Secondary | ICD-10-CM

## 2024-02-15 MED ORDER — FLUCONAZOLE 150 MG PO TABS: 150.0000 mg | ORAL_TABLET | Freq: Once | ORAL | 0 refills | Status: AC

## 2024-02-15 MED ORDER — METRONIDAZOLE 500 MG PO TABS: 500.0000 mg | ORAL_TABLET | Freq: Two times a day (BID) | ORAL | 0 refills | Status: AC

## 2024-02-29 ENCOUNTER — Other Ambulatory Visit: Payer: Self-pay | Admitting: Urology

## 2024-02-29 DIAGNOSIS — N3020 Other chronic cystitis without hematuria: Secondary | ICD-10-CM

## 2024-03-01 ENCOUNTER — Encounter: Payer: Self-pay | Admitting: Urology

## 2024-03-01 NOTE — Progress Notes (Unsigned)
 Cardiology Office Note    Date:  03/02/2024  ID:  [name] [name], DOB 01/21/1981, MRN [id number] PCP:  [name] Atlas, MD  Cardiologist: New, seen in [facility] Electrophysiologist:  None   Chief Complaint: f/u cardiac testing  History of Present Illness: .    [name] [name] is a 43 y.o. female with visit-pertinent history of asthma in childhood, GERD, anxiety, CKD 3a by labs, hysterectomy seen for evaluation of chest pain at the request of Dr. Darra.  She was evaluated 11/2023 in [facility] with episodic chest pain with mixed features and various symptoms in the setting of recent increase in anxiety. She had had ER visit with negative troponin. There was also mention of palpitations in ED note though not a salient complaint in follow-up. See OF for full details. Her mother died in Oct 23, 2023 after being diagnosed with CHF 2 months prior, also had CAD but was told poor candidate for cath due to schizophrenia (Dr. Claudene was her cardiologist), father had defibrillator for unclear reasons, also had h/o seizures and alcoholism. Echo 12/2023 showed EF 60-65%, no significant abnormalities. Stress test was low risk, no ischemia or infarction, no coronary calcification. The overread incidentally noted aortic atherosclerosis.  She returns for follow-up doing well from a cardiac standpoint doing well without any CP, palpitations, or SOB. She does report that on Monday her PCP started her on escitalopram for anxiety and she developed symptoms after 1 dose including dizziness, nausea, vomiting, and abdominal discomfort. Symptoms have somewhat improved today but still having some abdominal discomfort. No fevers or chills. Able to ambulate without difficulty. No pre-syncope or syncope. She is awaiting a call back from primary care.  Labwork independently reviewed: 11/2023 troponin neg, CBC wnl, K 3.5, Cr 1.18, TSH OK 23-Oct-2023 LFTs ok 12/2022 Mg wnl  ROS: .    Please see the history of present illness.   All other systems are reviewed and otherwise negative.  Studies Reviewed: SABRA    EKG:  EKG is not ordered today  CV Studies: Cardiac studies reviewed are outlined and summarized above. Otherwise please see EMR for full report.   Current Reported Medications:.    Current Meds  Medication Sig   Vitamin D , Ergocalciferol , (DRISDOL) 1.25 MG (50000 UNIT) CAPS capsule Take 50,000 Units by mouth once a week.    Physical Exam:    VS:  BP 130/82   Pulse 64   Ht 5' 4 (1.626 m)   Wt 140 lb (63.5 kg)   LMP 07/30/2013   SpO2 97%   BMI 24.03 kg/m    Wt Readings from Last 3 Encounters:  03/02/24 140 lb (63.5 kg)  02/11/24 147 lb (66.7 kg)  12/14/23 155 lb 6.4 oz (70.5 kg)    GEN: Well nourished, well developed in no acute distress. Well appearing NECK: No JVD; No carotid bruits CARDIAC: RRR, no murmurs, rubs, gallops RESPIRATORY:  Clear to auscultation without rales, wheezing or rhonchi  ABDOMEN: Soft, non-tender, non-distended EXTREMITIES:  No edema; No acute deformity   Asessement and Plan:.    1. Suspected noncardiac chest pain - stress testing and echo reassuring. Chest pain and dyspnea have resolved. No further workup at this time. She will notify for recurrent/worsening symptoms.  2. Aortic atherosclerosis - she would like us  to check her lipids. Will obtain lipid profile/CMET today. If statin therapy is indicated, would likely have her wait until after her GI symptoms have settled before starting.  3. Family history of cardiomyopathy - echocardiogram reassuring.  4. CKD 3a - she would like to pursue referral to nephrology for baseline evaluation.  5. Nausea/vomiting - started after dose of escitalopram earlier this week, awaiting input from primary care per her report. Since we are getting CMET/lipid profile above, will check CBC and share results with PCP in case there is anything else they need to evaluate for.  6. Elevated BP reading without dx of HTN - initial BP  146/96, recheck 130/82. She relates this to her ongoing anxiety. Discussed following BP at home and notifying PCP if blood pressure tends to run 130 systolic or higher regularly. Continue primary care follow-up.     Disposition: F/u with cardiology PRN.  Signed, Jalesia Loudenslager N Izela Altier, PA-C

## 2024-03-02 ENCOUNTER — Encounter: Payer: Self-pay | Admitting: Physician Assistant

## 2024-03-02 ENCOUNTER — Ambulatory Visit: Attending: Physician Assistant | Admitting: Physician Assistant

## 2024-03-02 VITALS — BP 130/82 | HR 64 | Ht 64.0 in | Wt 140.0 lb

## 2024-03-02 DIAGNOSIS — R0789 Other chest pain: Secondary | ICD-10-CM | POA: Diagnosis present

## 2024-03-02 DIAGNOSIS — I700 Atherosclerosis of aorta: Secondary | ICD-10-CM | POA: Diagnosis not present

## 2024-03-02 DIAGNOSIS — Z8249 Family history of ischemic heart disease and other diseases of the circulatory system: Secondary | ICD-10-CM | POA: Insufficient documentation

## 2024-03-02 DIAGNOSIS — R112 Nausea with vomiting, unspecified: Secondary | ICD-10-CM | POA: Diagnosis present

## 2024-03-02 LAB — LIPID PANEL

## 2024-03-02 NOTE — Patient Instructions (Signed)
 Medication Instructions:  Your physician recommends that you continue on your current medications as directed. Please refer to the Current Medication list given to you today.  *If you need a refill on your cardiac medications before your next appointment, please call your pharmacy*  Lab Work: TODAY:  CMET, CBC, & LIPID   If you have labs (blood work) drawn today and your tests are completely normal, you will receive your results only by: MyChart Message (if you have MyChart) OR A paper copy in the mail If you have any lab test that is abnormal or we need to change your treatment, we will call you to review the results.  Testing/Procedures: None ordered  You have been referred to [facility].  They will contact you with an appointment.   Follow-Up: At [facility], you and your health needs are our priority.  As part of our continuing mission to provide you with exceptional heart care, our providers are all part of one team.  This team includes your primary Cardiologist (physician) and Advanced Practice Providers or APPs (Physician Assistants and Nurse Practitioners) who all work together to provide you with the care you need, when you need it.  Your next appointment:   As needed  Provider:   Dayna Dunn, PA-C          We recommend signing up for the patient portal called MyChart.  Sign up information is provided on this After Visit Summary.  MyChart is used to connect with patients for Virtual Visits (Telemedicine).  Patients are able to view lab/test results, encounter notes, upcoming appointments, etc.  Non-urgent messages can be sent to your provider as well.   To learn more about what you can do with MyChart, go to [email or web address].   Other Instructions

## 2024-03-03 ENCOUNTER — Emergency Department (HOSPITAL_COMMUNITY)
Admission: EM | Admit: 2024-03-03 | Discharge: 2024-03-04 | Disposition: A | Discharge status: Home / Self Care | Attending: Emergency Medicine | Admitting: Emergency Medicine

## 2024-03-03 ENCOUNTER — Other Ambulatory Visit (HOSPITAL_COMMUNITY): Payer: Self-pay

## 2024-03-03 ENCOUNTER — Ambulatory Visit: Payer: Self-pay | Admitting: Physician Assistant

## 2024-03-03 DIAGNOSIS — H109 Unspecified conjunctivitis: Secondary | ICD-10-CM | POA: Diagnosis present

## 2024-03-03 DIAGNOSIS — H15101 Unspecified episcleritis, right eye: Secondary | ICD-10-CM | POA: Diagnosis not present

## 2024-03-03 DIAGNOSIS — R200 Anesthesia of skin: Secondary | ICD-10-CM | POA: Diagnosis present

## 2024-03-03 DIAGNOSIS — R030 Elevated blood-pressure reading, without diagnosis of hypertension: Secondary | ICD-10-CM | POA: Insufficient documentation

## 2024-03-03 DIAGNOSIS — E7849 Other hyperlipidemia: Secondary | ICD-10-CM

## 2024-03-03 DIAGNOSIS — N189 Chronic kidney disease, unspecified: Secondary | ICD-10-CM | POA: Diagnosis not present

## 2024-03-03 LAB — COMPREHENSIVE METABOLIC PANEL WITH GFR
ALT: 11 IU/L (ref 0–32)
AST: 16 IU/L (ref 0–40)
Albumin: 4.5 g/dL (ref 3.9–4.9)
Alkaline Phosphatase: 51 IU/L (ref 41–116)
BUN/Creatinine Ratio: 12 (ref 9–23)
BUN: 14 mg/dL (ref 6–24)
Bilirubin Total: <0.2 mg/dL (ref 0.0–1.2)
CO2: 23 mmol/L (ref 20–29)
Calcium: 9.4 mg/dL (ref 8.7–10.2)
Chloride: 100 mmol/L (ref 96–106)
Creatinine, Ser: 1.13 mg/dL — AB (ref 0.57–1.00)
Globulin, Total: 2.4 g/dL (ref 1.5–4.5)
Glucose: 90 mg/dL (ref 70–99)
Potassium: 4.9 mmol/L (ref 3.5–5.2)
Sodium: 137 mmol/L (ref 134–144)
Total Protein: 6.9 g/dL (ref 6.0–8.5)
eGFR: 62 mL/min/1.73 (ref >59)

## 2024-03-03 LAB — CBC
Hematocrit: 41.4 % (ref 34.0–46.6)
Hemoglobin: 13.5 g/dL (ref 11.1–15.9)
MCH: 31.5 pg (ref 26.6–33.0)
MCHC: 32.6 g/dL (ref 31.5–35.7)
MCV: 97 fL (ref 79–97)
Platelets: 283 x10E3/uL (ref 150–450)
RBC: 4.28 x10E6/uL (ref 3.77–5.28)
RDW: 13.2 % (ref 11.7–15.4)
WBC: 7.7 x10E3/uL (ref 3.4–10.8)

## 2024-03-03 LAB — LIPID PANEL
Cholesterol, Total: 252 mg/dL — AB (ref 100–199)
HDL: 80 mg/dL (ref >39)
LDL CALC COMMENT:: 3.2 ratio (ref 0.0–4.4)
LDL Chol Calc (NIH): 163 mg/dL — AB (ref 0–99)
Triglycerides: 56 mg/dL (ref 0–149)
VLDL Cholesterol Cal: 9 mg/dL (ref 5–40)

## 2024-03-03 MED ORDER — ROSUVASTATIN CALCIUM 10 MG PO TABS
10.0000 mg | ORAL_TABLET | Freq: Every day | ORAL | 0 refills | Status: AC
Start: 2024-03-03 — End: 2024-06-01
  Filled 2024-03-03: qty 90, 90d supply, fill #0

## 2024-03-03 NOTE — Telephone Encounter (Signed)
 Rx for rosuvastatin 10mg  daily sent in. Please enter lab orders for fasting lipid panel/LFTs in 3 months and notify patient to return for labs at that time. Thank you.

## 2024-03-04 ENCOUNTER — Emergency Department (HOSPITAL_COMMUNITY)
Admission: EM | Admit: 2024-03-04 | Discharge: 2024-03-05 | Disposition: A | Discharge status: Home / Self Care | Source: Home / Self Care | Attending: Emergency Medicine | Admitting: Emergency Medicine

## 2024-03-04 ENCOUNTER — Encounter (HOSPITAL_COMMUNITY): Payer: Self-pay | Admitting: *Deleted

## 2024-03-04 ENCOUNTER — Other Ambulatory Visit: Payer: Self-pay

## 2024-03-04 ENCOUNTER — Encounter (HOSPITAL_COMMUNITY): Payer: Self-pay | Admitting: Emergency Medicine

## 2024-03-04 DIAGNOSIS — R030 Elevated blood-pressure reading, without diagnosis of hypertension: Secondary | ICD-10-CM | POA: Insufficient documentation

## 2024-03-04 DIAGNOSIS — R200 Anesthesia of skin: Secondary | ICD-10-CM | POA: Insufficient documentation

## 2024-03-04 DIAGNOSIS — N189 Chronic kidney disease, unspecified: Secondary | ICD-10-CM | POA: Insufficient documentation

## 2024-03-04 NOTE — ED Triage Notes (Signed)
 Patient c/o right eye redness x 20 mins ago. Patient denies eye pain or itching.

## 2024-03-04 NOTE — ED Triage Notes (Signed)
 The pt reports numbness in her face and high blood pressure for three days and she was seen at [facility] ed last pm for the same problems  lmp hysterectomy

## 2024-03-04 NOTE — ED Notes (Signed)
 20/15 visual acuity score bilateral

## 2024-03-04 NOTE — Discharge Instructions (Addendum)
 It was a pleasure taking care of you today. You were seen in the Emergency Department for redness. Your work-up was reassuring. Your visual acuity is normal in both eyes.  Refer to the attached documentation for further management of your symptoms. Follow up with your PCP if your symptoms continue.  In addition, your blood pressure was elevated during your visit today.  I have provided you with a record sheet to track your blood pressure as well as directions on how to check your blood pressure.  I recommend following up with your PCP for further evaluation.  Please return to the ER if you experience chest pain, trouble breathing, intractable nausea/vomiting or any other life threatening illnesses.

## 2024-03-04 NOTE — ED Provider Notes (Signed)
 [place] EMERGENCY DEPARTMENT AT [facility] Provider Note   CSN: [id number] Arrival date & time: 03/03/24  2358     Patient presents with: Conjunctivitis   [name] [name] is a 43 y.o. female with no pertinent past medical history presents to the emergency department for evaluation of redness of the right eye.  Patient reports symptoms started 20 minutes prior to arrival.  No trauma to the eye.  No photophobia, pain, excessive tearing, or foreign body sensation.    Conjunctivitis       Prior to Admission medications   Medication Sig Start Date End Date Taking? Authorizing Provider  escitalopram (LEXAPRO) 20 MG tablet Take 20 mg by mouth daily. Patient not taking: Reported on 03/02/2024    [provider]  hydrOXYzine (ATARAX) 25 MG tablet Take 25 mg by mouth daily as needed for anxiety. Patient not taking: Reported on 03/02/2024    [provider]  pantoprazole  (PROTONIX ) 40 MG tablet Take 1 tablet (40 mg total) by mouth daily. Patient not taking: Reported on 03/02/2024 10/08/23 12/14/23  Roselyn Carlin NOVAK, MD  rosuvastatin (CRESTOR) 10 MG tablet Take 1 tablet (10 mg total) by mouth daily. 03/03/24 06/01/24  Dunn, Dayna N, PA-C  Vitamin D , Ergocalciferol , (DRISDOL) 1.25 MG (50000 UNIT) CAPS capsule Take 50,000 Units by mouth once a week. 11/18/23   [provider]    Allergies: Patient has no known allergies.    Review of Systems  Updated Vital Signs BP (!) 175/112 Comment: Patient stated she feels better now. Nurse informed patient to get her BOP cuff that was reccommened from her PCP per patient and keep logs of her BP and if she has any concerns to come back to ER.  Pulse 96   Temp 98.3 F (36.8 C) (Oral)   Resp 20   Ht 5' 4.5 (1.638 m)   Wt 64.9 kg   LMP 07/30/2013   SpO2 98%   BMI 24.17 kg/m   Physical Exam Vitals and nursing note reviewed.  Constitutional:      Appearance: Normal appearance. She is not ill-appearing.   Eyes:     General: No scleral icterus.       Right eye: No discharge.        Left eye: No discharge.     Extraocular Movements: Extraocular movements intact.     Pupils: Pupils are equal, round, and reactive to light.     Comments: Right eye conjunctiva erythematous.  EOMs intact bilaterally.  No nystagmus.  No consensual photophobia.  Pulmonary:     Effort: Pulmonary effort is normal. No respiratory distress.  Musculoskeletal:        General: No deformity.  Skin:    Coloration: Skin is not jaundiced.  Neurological:     General: No focal deficit present.     Mental Status: She is alert.  Psychiatric:        Mood and Affect: Mood normal.     (all labs ordered are listed, but only abnormal results are displayed) Labs Reviewed - No data to display  EKG: None  Radiology: No results found.   Procedures   Medications Ordered in the ED - No data to display                                 Medical Decision Making  This patient presents to the ED for concern of eye redness, this involves an  extensive number of treatment options, and is a complaint that carries with it a high risk of complications and morbidity.  Differential diagnosis includes: Acute angle-closure glaucoma, viral conjunctivitis, bacterial conjunctivitis, anterior uveitis, episcleritis, hyphema  Co morbidities:  none   Lab Tests:  None  Imaging Studies:  None  Cardiac Monitoring/ECG:  None  Medicines ordered and prescription drug management:  Not indicated  Test Considered:   none  Critical Interventions:   none  Consultations Obtained: None  Problem List / ED Course:     ICD-10-CM   1. Episcleritis of right eye  H15.101     2. Elevated blood pressure reading without diagnosis of hypertension  R03.0       MDM: Patient is a 43 year old female who presents to the ED for evaluation of right eye redness.  Less likely acute closure glaucoma due to lack of pain.  Fluorescein study  not indicated given lack of foreign body sensation for pain with eye movements.  Tono-Pen measurements not indicated due to lack of payment so less likely glaucoma.  Visual acuity 20/15 bilaterally, with correction.  No consensual photophobia.  EOMs intact.  No nystagmus.  Due to lack of findings on physical exam, episcleritis will likely resolve on its own within a few days.  Patient's blood pressure was also noted to be elevated.  She does not have a diagnosis of hypertension.  Patient educated on how to measure her blood pressure at home and recommendation for follow-up with PCP as provided.  Patient is stable for discharge at this time.   Dispostion:  After consideration of the diagnostic results and the patients response to treatment, I feel that the patient would benefit from outpatient symptom management.   Final diagnoses:  Episcleritis of right eye  Elevated blood pressure reading without diagnosis of hypertension    ED Discharge Orders     None          Torrence Marry GORMAN DEVONNA 03/04/24 [id number]    Palumbo, April, MD 03/04/24 [phone]

## 2024-03-04 NOTE — ED Notes (Signed)
 Patient d/c'd with home care instructions. VS obtained.

## 2024-03-05 ENCOUNTER — Emergency Department (HOSPITAL_COMMUNITY)

## 2024-03-05 LAB — COMPREHENSIVE METABOLIC PANEL WITH GFR
ALT: 11 U/L (ref 0–44)
AST: 16 U/L (ref 15–41)
Albumin: 3.7 g/dL (ref 3.5–5.0)
Alkaline Phosphatase: 37 U/L — ABNORMAL LOW (ref 38–126)
Anion gap: 9 (ref 5–15)
BUN: 12 mg/dL (ref 6–20)
CO2: 22 mmol/L (ref 22–32)
Calcium: 9.3 mg/dL (ref 8.9–10.3)
Chloride: 106 mmol/L (ref 98–111)
Creatinine, Ser: 1.08 mg/dL — ABNORMAL HIGH (ref 0.44–1.00)
GFR, Estimated: >60 mL/min (ref >60)
Glucose, Bld: 94 mg/dL (ref 70–99)
Potassium: 3.6 mmol/L (ref 3.5–5.1)
Sodium: 137 mmol/L (ref 135–145)
Total Bilirubin: 0.3 mg/dL (ref 0.0–1.2)
Total Protein: 6.6 g/dL (ref 6.5–8.1)

## 2024-03-05 LAB — TROPONIN I (HIGH SENSITIVITY)
Troponin I (High Sensitivity): <2 ng/L (ref <18)
Troponin I (High Sensitivity): <2 ng/L (ref <18)

## 2024-03-05 LAB — URINALYSIS, ROUTINE W REFLEX MICROSCOPIC
Bacteria, UA: NONE SEEN
Bilirubin Urine: NEGATIVE
Glucose, UA: NEGATIVE mg/dL
Hgb urine dipstick: NEGATIVE
Ketones, ur: NEGATIVE mg/dL
Nitrite: NEGATIVE
Protein, ur: NEGATIVE mg/dL
Specific Gravity, Urine: 1.006 (ref 1.005–1.030)
pH: 6 (ref 5.0–8.0)

## 2024-03-05 LAB — CBC
HCT: 39.6 % (ref 36.0–46.0)
Hemoglobin: 13.1 g/dL (ref 12.0–15.0)
MCH: 30.8 pg (ref 26.0–34.0)
MCHC: 33.1 g/dL (ref 30.0–36.0)
MCV: 93 fL (ref 80.0–100.0)
Platelets: 268 K/uL (ref 150–400)
RBC: 4.26 MIL/uL (ref 3.87–5.11)
RDW: 12.8 % (ref 11.5–15.5)
WBC: 6.7 K/uL (ref 4.0–10.5)
nRBC: 0 % (ref 0.0–0.2)

## 2024-03-05 LAB — LIPASE, BLOOD: Lipase: 29 U/L (ref 11–51)

## 2024-03-05 NOTE — ED Notes (Signed)
 Discharge instructions reviewed.   Opportunity for questions and concerns provided.   Alert, oriented and ambulatory.   Displays no signs of distress.   Encouraged to monitor blood pressure readings and follow up with PCP to determine if medication is necessary.

## 2024-03-05 NOTE — ED Provider Notes (Signed)
 [place] EMERGENCY DEPARTMENT AT [facility] Provider Note   CSN: [id number] Arrival date & time: 03/04/24  2326     Patient presents with: Numbness   [name] [name] is a 43 y.o. female.   43 year old female with a history of CKD, anxiety presents to the emergency department for evaluation.  States that she has not felt like herself today.  Her complaints are, somewhat, nonspecific/vague.  Was seen in the emergency department last night for right eye redness which has spontaneously improved.  Because she arrived home very early in the morning, she slept most of the day.  When she awoke later in the afternoon, she noted some subjective numbness in her face. This spontaneously subsided, but recurred later in the evening; reports symptoms were perioral and in the lower face, bilaterally.  No headaches, fever, vomiting, syncope, extremity weakness, complete vision loss.  Not currently taking anything for anxiety.   The history is provided by the patient. No language interpreter was used.       Prior to Admission medications   Medication Sig Start Date End Date Taking? Authorizing Provider  escitalopram (LEXAPRO) 20 MG tablet Take 20 mg by mouth daily. Patient not taking: Reported on 03/02/2024    [provider]  hydrOXYzine (ATARAX) 25 MG tablet Take 25 mg by mouth daily as needed for anxiety. Patient not taking: Reported on 03/02/2024    [provider]  pantoprazole  (PROTONIX ) 40 MG tablet Take 1 tablet (40 mg total) by mouth daily. Patient not taking: Reported on 03/02/2024 10/08/23 12/14/23  Roselyn Carlin NOVAK, MD  rosuvastatin (CRESTOR) 10 MG tablet Take 1 tablet (10 mg total) by mouth daily. 03/03/24 06/01/24  Dunn, Dayna N, PA-C  Vitamin D , Ergocalciferol , (DRISDOL) 1.25 MG (50000 UNIT) CAPS capsule Take 50,000 Units by mouth once a week. 11/18/23   [provider]    Allergies: Patient has no known allergies.    Review of Systems Ten systems  reviewed and are negative for acute change, except as noted in the HPI.    Updated Vital Signs BP (!) 155/100   Pulse 79   Temp 98.5 F (36.9 C) (Oral)   Resp 18   Ht 5' 4 (1.626 m)   Wt 64.9 kg   LMP 07/30/2013   SpO2 100%   BMI 24.56 kg/m   Physical Exam Vitals and nursing note reviewed.  Constitutional:      General: She is not in acute distress.    Appearance: She is well-developed. She is not diaphoretic.     Comments: Nontoxic appearing and in NAD  HENT:     Head: Normocephalic and atraumatic.  Eyes:     General: No scleral icterus.    Conjunctiva/sclera: Conjunctivae normal.  Cardiovascular:     Rate and Rhythm: Normal rate and regular rhythm.     Pulses: Normal pulses.  Pulmonary:     Effort: Pulmonary effort is normal. No respiratory distress.     Comments: Respirations even and unlabored Musculoskeletal:        General: Normal range of motion.     Cervical back: Normal range of motion.  Skin:    General: Skin is warm and dry.     Coloration: Skin is not pale.     Findings: No erythema or rash.  Neurological:     Mental Status: She is alert and oriented to person, place, and time.     Coordination: Coordination normal.     Comments: GCS 15. Speech  is goal oriented. No deficits appreciated to CN III-XII; symmetric eyebrow raise, no facial drooping, tongue midline. Patient has equal grip strength bilaterally with 5/5 strength against resistance in all major muscle groups bilaterally. Sensation to light touch intact. Patient moves extremities without ataxia.  Psychiatric:        Behavior: Behavior normal.     (all labs ordered are listed, but only abnormal results are displayed) Labs Reviewed  COMPREHENSIVE METABOLIC PANEL WITH GFR - Abnormal; Notable for the following components:      Result Value   Creatinine, Ser 1.08 (*)    Alkaline Phosphatase 37 (*)    All other components within normal limits  URINALYSIS, ROUTINE W REFLEX MICROSCOPIC - Abnormal;  Notable for the following components:   Color, Urine STRAW (*)    Leukocytes,Ua SMALL (*)    All other components within normal limits  LIPASE, BLOOD  CBC  TROPONIN I (HIGH SENSITIVITY)  TROPONIN I (HIGH SENSITIVITY)    EKG: EKG Interpretation Date/Time:  Saturday March 04 2024 23:42:51 EDT Ventricular Rate:  83 PR Interval:  146 QRS Duration:  72 QT Interval:  340 QTC Calculation: 399 R Axis:   33  Text Interpretation: Normal sinus rhythm Normal ECG When compared with ECG of 14-Dec-2023 15:02, PREVIOUS ECG IS PRESENT Confirmed by Jerrol Agent ([id number]) on 03/05/2024 3:26:15 AM  Radiology: CT HEAD WO CONTRAST ( ) Result Date: 03/05/2024 EXAM: CT HEAD WITHOUT CONTRAST 03/05/2024 04:31:38 AM TECHNIQUE: CT of the head was performed without the administration of intravenous contrast. Automated exposure control, iterative reconstruction, and/or weight based adjustment of the mA/kV was utilized to reduce the radiation dose to as low as reasonably achievable. COMPARISON: None available. CLINICAL HISTORY: Facial numbness. FINDINGS: BRAIN AND VENTRICLES: No acute hemorrhage. No evidence of acute infarct. No hydrocephalus. No extra-axial collection. No mass effect or midline shift. ORBITS: No acute abnormality. SINUSES: No acute abnormality. SOFT TISSUES AND SKULL: No acute soft tissue abnormality. No skull fracture. IMPRESSION: 1. No acute intracranial abnormality. Electronically signed by: Evalene Coho MD 03/05/2024 04:55 AM EDT RP Workstation: [id number]     Procedures   Medications Ordered in the ED - No data to display  Clinical Course as of 03/05/24 0524  Sun Mar 05, 2024  0501 CT negative for acute intracranial abnormality. [KH]    Clinical Course User Index [KH] Keith Sor, PA-C                                 Medical Decision Making Amount and/or Complexity of Data Reviewed Radiology: ordered.   This patient presents to the ED for concern of subjective numbness and  not feeling right, this involves an extensive number of treatment options, and is a complaint that carries with it a high risk of complications and morbidity.  The differential diagnosis includes symptomatic HTN vs CVA/TIA vs ingestion vs anxiety attack   Co morbidities that complicate the patient evaluation  CKD Anxiety   Additional history obtained:  Additional history obtained from EMS personnel   Lab Tests:  I Ordered, and personally interpreted labs.  The pertinent results include:  Creatinine 1.08 (improved from prior).   Imaging Studies ordered:  I ordered imaging studies including CT head  I independently visualized and interpreted imaging which showed no acute intracranial abnormality I agree with the radiologist interpretation   Cardiac Monitoring:  The patient was maintained on a cardiac monitor.  I personally viewed and interpreted  the cardiac monitored which showed an underlying rhythm of: NSR   Medicines ordered and prescription drug management:  I have reviewed the patients home medicines and have made adjustments as needed   Test Considered:  UDS   Problem List / ED Course:  As above Presenting primarily for not feeling like myself today. Reports some episodes of subjective numbness periorally and to the lower face, bilaterally. Asymptomatic at the time of my bedside assessment. Neurovascularly intact on exam; no focal deficits. Expresses primary concern for stroke; however, her symptoms are consistent with CVA as well as TIA.  She has not had any unilateral complaints.  Suspect more so that her subjective numbness is related to her history of anxiety.  She is not currently medicated for this. Did perform screening CT which is negative.  Blood pressure has been elevated in the ED, but not astronomically high.  She has had transient episodes of hypertension in the past, most recently yesterday when assessed at [facility], ED.  Stable to continue  follow-up with her primary care doctor as well as her cardiologist for reassessment of this. Return precautions discussed and provided. Patient discharged in stable condition with no unaddressed concerns.   Reevaluation:  After the interventions noted above, I reevaluated the patient and found that they have :improved   Social Determinants of Health:   Lives independently   Dispostion:  After consideration of the diagnostic results and the patients response to treatment, I feel that the patent would benefit from outpatient f/u for recheck.  No indication for further emergent workup at this time.  Return precautions discussed and provided. Patient discharged in stable condition with no unaddressed concerns.       Final diagnoses:  Elevated blood pressure reading    ED Discharge Orders     None          Keith Sor, PA-C 03/05/24 0530    Jerrol Agent, MD 03/05/24 [phone]

## 2024-03-05 NOTE — Discharge Instructions (Signed)
 Continue monitoring your blood pressure readings.  Follow-up with a primary care doctor and/or your cardiologist to discuss whether you need to begin use of a blood pressure medication.  You may return to the ED for new or concerning symptoms.

## 2024-03-06 NOTE — Addendum Note (Signed)
 Addended by: MEMORY DELON POUR on: 03/06/2024 12:02 PM   Modules accepted: Orders

## 2024-03-15 ENCOUNTER — Other Ambulatory Visit (HOSPITAL_COMMUNITY): Payer: Self-pay

## 2024-03-17 ENCOUNTER — Ambulatory Visit
Admission: RE | Admit: 2024-03-17 | Discharge: 2024-03-17 | Disposition: A | Discharge status: Home / Self Care | Source: Ambulatory Visit | Attending: Urology | Admitting: Urology

## 2024-03-17 DIAGNOSIS — N3020 Other chronic cystitis without hematuria: Secondary | ICD-10-CM

## 2024-03-17 MED ORDER — GADOPICLENOL 0.5 MMOL/ML IV SOLN
7.0000 mL | Freq: Once | INTRAVENOUS | Status: AC | PRN
  Administered 2024-03-17: 7 mL via INTRAVENOUS
